# Patient Record
Sex: Female | Born: 1935 | Race: White | Hispanic: No | Smoking: Never smoker
Health system: Southern US, Community
[De-identification: ages and names within clinical notes are randomized; demographics above are authoritative.]

## PROBLEM LIST (undated history)

## (undated) DIAGNOSIS — R9430 Abnormal result of cardiovascular function study, unspecified: Secondary | ICD-10-CM

## (undated) DIAGNOSIS — I050 Rheumatic mitral stenosis: Secondary | ICD-10-CM

## (undated) DIAGNOSIS — I340 Nonrheumatic mitral (valve) insufficiency: Secondary | ICD-10-CM

## (undated) DIAGNOSIS — I10 Essential (primary) hypertension: Secondary | ICD-10-CM

## (undated) DIAGNOSIS — G309 Alzheimer's disease, unspecified: Principal | ICD-10-CM

## (undated) DIAGNOSIS — I5189 Other ill-defined heart diseases: Secondary | ICD-10-CM

## (undated) DIAGNOSIS — I350 Nonrheumatic aortic (valve) stenosis: Secondary | ICD-10-CM

## (undated) DIAGNOSIS — IMO0002 Reserved for concepts with insufficient information to code with codable children: Secondary | ICD-10-CM

## (undated) DIAGNOSIS — E119 Type 2 diabetes mellitus without complications: Secondary | ICD-10-CM

## (undated) DIAGNOSIS — E785 Hyperlipidemia, unspecified: Secondary | ICD-10-CM

## (undated) DIAGNOSIS — I517 Cardiomegaly: Secondary | ICD-10-CM

## (undated) DIAGNOSIS — F0280 Dementia in other diseases classified elsewhere without behavioral disturbance: Principal | ICD-10-CM

## (undated) HISTORY — DX: Abnormal result of cardiovascular function study, unspecified: R94.30

## (undated) HISTORY — DX: Other ill-defined heart diseases: I51.89

## (undated) HISTORY — DX: Alzheimer's disease, unspecified: G30.9

## (undated) HISTORY — DX: Cardiomegaly: I51.7

## (undated) HISTORY — PX: TOTAL HIP ARTHROPLASTY: SHX124

## (undated) HISTORY — DX: Reserved for concepts with insufficient information to code with codable children: IMO0002

## (undated) HISTORY — DX: Type 2 diabetes mellitus without complications: E11.9

## (undated) HISTORY — DX: Rheumatic mitral stenosis: I05.0

## (undated) HISTORY — DX: Nonrheumatic mitral (valve) insufficiency: I34.0

## (undated) HISTORY — DX: Nonrheumatic aortic (valve) stenosis: I35.0

## (undated) HISTORY — PX: ABDOMINAL HYSTERECTOMY: SHX81

## (undated) HISTORY — DX: Dementia in other diseases classified elsewhere without behavioral disturbance: F02.80

## (undated) HISTORY — DX: Hyperlipidemia, unspecified: E78.5

## (undated) HISTORY — DX: Essential (primary) hypertension: I10

---

## 2001-02-23 ENCOUNTER — Other Ambulatory Visit: Admission: RE | Admit: 2001-02-23 | Discharge: 2001-02-23 | Payer: Self-pay | Admitting: Obstetrics and Gynecology

## 2002-07-26 ENCOUNTER — Ambulatory Visit (HOSPITAL_COMMUNITY): Admission: RE | Admit: 2002-07-26 | Discharge: 2002-07-26 | Payer: Self-pay | Admitting: Internal Medicine

## 2004-07-24 ENCOUNTER — Ambulatory Visit: Payer: Self-pay | Admitting: Family Medicine

## 2004-08-06 ENCOUNTER — Ambulatory Visit: Payer: Self-pay | Admitting: Family Medicine

## 2004-08-12 ENCOUNTER — Ambulatory Visit: Payer: Self-pay | Admitting: Family Medicine

## 2004-08-27 ENCOUNTER — Ambulatory Visit: Payer: Self-pay | Admitting: Family Medicine

## 2005-02-23 ENCOUNTER — Ambulatory Visit: Payer: Self-pay | Admitting: Family Medicine

## 2005-06-02 ENCOUNTER — Encounter: Payer: Self-pay | Admitting: Cardiology

## 2005-06-02 ENCOUNTER — Ambulatory Visit: Payer: Self-pay | Admitting: Family Medicine

## 2005-06-03 ENCOUNTER — Ambulatory Visit: Payer: Self-pay | Admitting: Cardiology

## 2005-06-16 ENCOUNTER — Ambulatory Visit: Payer: Self-pay | Admitting: Cardiology

## 2005-09-01 ENCOUNTER — Ambulatory Visit: Payer: Self-pay | Admitting: Family Medicine

## 2005-11-30 ENCOUNTER — Ambulatory Visit: Payer: Self-pay | Admitting: Family Medicine

## 2006-06-08 ENCOUNTER — Ambulatory Visit: Payer: Self-pay | Admitting: Family Medicine

## 2006-07-12 ENCOUNTER — Ambulatory Visit: Payer: Self-pay | Admitting: Cardiology

## 2006-12-02 ENCOUNTER — Ambulatory Visit: Payer: Self-pay | Admitting: Family Medicine

## 2007-03-04 ENCOUNTER — Ambulatory Visit: Payer: Self-pay | Admitting: Family Medicine

## 2007-08-04 ENCOUNTER — Ambulatory Visit: Payer: Self-pay | Admitting: Cardiology

## 2007-08-16 ENCOUNTER — Ambulatory Visit: Payer: Self-pay | Admitting: Cardiology

## 2008-04-05 ENCOUNTER — Encounter: Payer: Self-pay | Admitting: Cardiology

## 2008-08-17 ENCOUNTER — Encounter: Payer: Self-pay | Admitting: Cardiology

## 2008-10-10 ENCOUNTER — Ambulatory Visit: Payer: Self-pay | Admitting: Cardiology

## 2009-04-10 DIAGNOSIS — E785 Hyperlipidemia, unspecified: Secondary | ICD-10-CM

## 2009-04-10 DIAGNOSIS — I10 Essential (primary) hypertension: Secondary | ICD-10-CM | POA: Insufficient documentation

## 2009-10-22 ENCOUNTER — Encounter: Payer: Self-pay | Admitting: Internal Medicine

## 2009-10-29 ENCOUNTER — Ambulatory Visit (HOSPITAL_COMMUNITY): Admission: RE | Admit: 2009-10-29 | Discharge: 2009-10-29 | Payer: Self-pay | Admitting: Internal Medicine

## 2009-10-29 ENCOUNTER — Ambulatory Visit: Payer: Self-pay | Admitting: Internal Medicine

## 2009-11-03 ENCOUNTER — Encounter: Payer: Self-pay | Admitting: Internal Medicine

## 2009-11-05 ENCOUNTER — Encounter: Payer: Self-pay | Admitting: Cardiology

## 2009-12-09 ENCOUNTER — Encounter: Payer: Self-pay | Admitting: Cardiology

## 2009-12-11 ENCOUNTER — Ambulatory Visit: Payer: Self-pay | Admitting: Cardiology

## 2009-12-17 ENCOUNTER — Ambulatory Visit: Payer: Self-pay | Admitting: Cardiology

## 2010-10-21 NOTE — Miscellaneous (Signed)
  Clinical Lists Changes  Problems: Removed problem of LEFT VENTRICULAR FUNCTION, DECREASED (ICD-429.2) Added new problem of AORTIC STENOSIS (ICD-424.1) Removed problem of MITRAL REGURG W/ AORTIC INSUFF, RHEUM/NON-RHEUM (ICD-396.3) Added new problem of MITRAL REGURGITATION (ICD-396.3) Observations: Added new observation of PAST MED HX: Aortic stenosis...mild to moderate.... echo... November, 2008 Mitral regurgitation...mild.... echo... November, 2008 Diastolic dysfunction.... echo.... November, 2008 EF 65%... echo... November, 2008 HYPERLIPIDEMIA-MIXED (ICD-272.4) HYPERTENSION, UNSPECIFIED (ICD-401.9)   (12/09/2009 16:48) Added new observation of PRIMARY MD: Joette Catching, MD (12/09/2009 16:48)       Past History:  Past Medical History: Aortic stenosis...mild to moderate.... echo... November, 2008 Mitral regurgitation...mild.... echo... November, 2008 Diastolic dysfunction.... echo.... November, 2008 EF 65%... echo... November, 2008 HYPERLIPIDEMIA-MIXED (ICD-272.4) HYPERTENSION, UNSPECIFIED (ICD-401.9)

## 2010-10-21 NOTE — Letter (Signed)
Summary: Internal Other [name]  Internal Other [name]   Imported By: Cloria Spring LPN 62/13/0865 78:46:96  _____________________________________________________________________  External Attachment:    Type:   Image     Comment:   External Document

## 2010-10-21 NOTE — Letter (Signed)
Summary: Patient Notice, Colon Biopsy Results  [facility]  [address]   [place], [place] [address]   Phone: [phone]  Fax: [phone]       November 03, 2009   [name] [address] [place], [place]  [address] [birth year]    Dear Ms. Furney,  I am pleased to inform you that the biopsies taken during your recent colonoscopy did not show any evidence of cancer upon pathologic examination.  Additional information/recommendations:  You should have a repeat colonoscopy examination  in 5 years.  Please call us if you are having persistent problems or have questions about your condition that have not been fully answered at this time.  Sincerely,    R. Roetta Sessions MD  [facility] Ph: [phone]    Fax: [phone]   Appended Document: Patient Notice, Colon Biopsy Results letter mailed to pt

## 2010-10-21 NOTE — Assessment & Plan Note (Signed)
Summary: 1 YR FU PER FEB REMINDER-SRS   Visit Type:  Follow-up Primary Provider:  Joette Catching, MD  CC:  aortic stenosis.  History of Present Illness: Patient is seen for followup of aortic stenosis and mitral regurgitation.  I saw her last January, 2010.  She is gaining weight and we talked about this.  She has shortness of breath only when walking up a hill.  She's not having any chest pain.  There is been no syncope or presyncope.  Preventive Screening-Counseling & Management  Alcohol-Tobacco     Smoking Status: never  Current Medications (verified): 1)  Fosinopril Sodium 10 Mg Tabs (Fosinopril Sodium) .... Take 1 Tablet By Mouth Once A Day 2)  Hydrochlorothiazide 25 Mg Tabs (Hydrochlorothiazide) .... Take 1 Tablet By Mouth Once A Day 3)  Caltrate 600+d 600-400 Mg-Unit Tabs (Calcium Carbonate-Vitamin D) .... Take 1 Tablet By Mouth Once A Day 4)  Fish Oil 1000 Mg Caps (Omega-3 Fatty Acids) .... Take 1 Tablet By Mouth Once A Day 5)  Crestor 20 Mg Tabs (Rosuvastatin Calcium) .... Take 1/2 Tablet By Mouth Once A Day 6)  Multivitamins  Tabs (Multiple Vitamin) .... Take 1 Tablet By Mouth Once A Day  Allergies (verified): 1)  ! Sulfa  Comments:  Nurse/Medical Assistant: The patient's medications were reviewed with the patient and were updated in the Medication List. Pt brought medication bottles to office visit.  Cyril Loosen, RN, BSN (December 11, 2009 12:23 PM)  Past History:  Past Medical History: Last updated: 12/09/2009 Aortic stenosis...mild to moderate.... echo... November, 2008 Mitral regurgitation...mild.... echo... November, 2008 Diastolic dysfunction.... echo.... November, 2008 EF 65%... echo... November, 2008 HYPERLIPIDEMIA-MIXED (ICD-272.4) HYPERTENSION, UNSPECIFIED (ICD-401.9)    Social History: Smoking Status:  never  Review of Systems       Patient denies fever, chills, headache, sweats, rash, change in vision, change in hearing, chest pain, cough,  nausea vomiting, urinary symptoms.  All other systems are reviewed and are negative.  Vital Signs:  Patient profile:   77 year old female Height:      62 inches Weight:      197 pounds BMI:     36.16 Pulse rate:   66 / minute BP sitting:   139 / 86  (left arm) Cuff size:   large  Vitals Entered By: Cyril Loosen, RN, BSN (December 11, 2009 12:18 PM)  Nutrition Counseling: Patient's BMI is greater than 25 and therefore counseled on weight management options. CC: aortic stenosis Comments No cardiac complaints   Physical Exam  General:  Patient is overweight but stable. Head:  head is atraumatic. Eyes:  no xanthelasma. Neck:  no jugular venous distention. Chest Wall:  no chest wall tenderness. Lungs:  lungs are clear.  Respiratory effort is nonlabored. Heart:  cardiac exam reveals an S1-S2.  There is a systolic murmur. Abdomen:  the abdomen is obese but soft Msk:  no musculoskeletal deformities Extremities:  no peripheral edema Skin:  no skin rashes Psych:  patient is oriented to person time and place.  Affect is normal.   Impression & Recommendations:  Problem # 1:  MITRAL REGURGITATION (ICD-396.3) Mitral regurgitation is mild by history.  It is time for followup 2-D echo.  Problem # 2:  AORTIC STENOSIS (ICD-424.1)  Her updated medication list for this problem includes:    Fosinopril Sodium 10 Mg Tabs (Fosinopril sodium) .Marland Kitchen... Take 1 tablet by mouth once a day    Hydrochlorothiazide 25 Mg Tabs (Hydrochlorothiazide) .Marland Kitchen... Take 1 tablet by mouth  once a day  Orders: EKG w/ Interpretation (93000) 2-D Echocardiogram (2D Echo) Aortic stenosis with mild to moderate 2008.  It is time for followup 2-D echo and this will be scheduled.  EKG is done today and reviewed by me.  She has normal sinus rhythm.  Problem # 3:  HYPERLIPIDEMIA-MIXED (ICD-272.4)  Her updated medication list for this problem includes:    Crestor 20 Mg Tabs (Rosuvastatin calcium) .Marland Kitchen... Take 1/2 tablet by  mouth once a day Patient is receiving appropriate therapy for her lipids.  No change in therapy.  Problem # 4:  HYPERTENSION, UNSPECIFIED (ICD-401.9)  Her updated medication list for this problem includes:    Fosinopril Sodium 10 Mg Tabs (Fosinopril sodium) .Marland Kitchen... Take 1 tablet by mouth once a day    Hydrochlorothiazide 25 Mg Tabs (Hydrochlorothiazide) .Marland Kitchen... Take 1 tablet by mouth once a day Blood pressure is controlled today.  No change in therapy.  Will obtain a 2-D echo.  I will review the results and be in touch with her.  I will see her for cardiology followup in one year.  Patient Instructions: 1)  Your physician recommends that you continue on your current medications as directed. Please refer to the Current Medication list given to you today. 2)  Your physician wants you to follow-up in: 1 year. You will receive a reminder letter in the mail about two months in advance. If you don't receive a letter, please call our office to schedule the follow-up appointment. 3)  Your physician has requested that you have an echocardiogram.  Echocardiography is a painless test that uses sound waves to create images of your heart. It provides your doctor with information about the size and shape of your heart and how well your heart's chambers and valves are working.  This procedure takes approximately one hour. There are no restrictions for this procedure.

## 2011-01-14 ENCOUNTER — Encounter: Payer: Self-pay | Admitting: *Deleted

## 2011-01-14 ENCOUNTER — Encounter: Payer: Self-pay | Admitting: Cardiology

## 2011-01-14 DIAGNOSIS — I517 Cardiomegaly: Secondary | ICD-10-CM | POA: Insufficient documentation

## 2011-01-14 DIAGNOSIS — E785 Hyperlipidemia, unspecified: Secondary | ICD-10-CM | POA: Insufficient documentation

## 2011-01-14 DIAGNOSIS — I5189 Other ill-defined heart diseases: Secondary | ICD-10-CM | POA: Insufficient documentation

## 2011-01-14 DIAGNOSIS — I340 Nonrheumatic mitral (valve) insufficiency: Secondary | ICD-10-CM | POA: Insufficient documentation

## 2011-01-14 DIAGNOSIS — R9430 Abnormal result of cardiovascular function study, unspecified: Secondary | ICD-10-CM | POA: Insufficient documentation

## 2011-01-14 DIAGNOSIS — I050 Rheumatic mitral stenosis: Secondary | ICD-10-CM | POA: Insufficient documentation

## 2011-01-14 DIAGNOSIS — I350 Nonrheumatic aortic (valve) stenosis: Secondary | ICD-10-CM | POA: Insufficient documentation

## 2011-01-14 DIAGNOSIS — I10 Essential (primary) hypertension: Secondary | ICD-10-CM | POA: Insufficient documentation

## 2011-01-15 ENCOUNTER — Ambulatory Visit (INDEPENDENT_AMBULATORY_CARE_PROVIDER_SITE_OTHER): Payer: Medicare HMO | Admitting: Cardiology

## 2011-01-15 ENCOUNTER — Encounter: Payer: Self-pay | Admitting: Cardiology

## 2011-01-15 DIAGNOSIS — I359 Nonrheumatic aortic valve disorder, unspecified: Secondary | ICD-10-CM

## 2011-01-15 DIAGNOSIS — I059 Rheumatic mitral valve disease, unspecified: Secondary | ICD-10-CM

## 2011-01-15 DIAGNOSIS — E785 Hyperlipidemia, unspecified: Secondary | ICD-10-CM

## 2011-01-15 DIAGNOSIS — I5189 Other ill-defined heart diseases: Secondary | ICD-10-CM

## 2011-01-15 DIAGNOSIS — I519 Heart disease, unspecified: Secondary | ICD-10-CM

## 2011-01-15 DIAGNOSIS — I10 Essential (primary) hypertension: Secondary | ICD-10-CM

## 2011-01-15 DIAGNOSIS — I050 Rheumatic mitral stenosis: Secondary | ICD-10-CM

## 2011-01-15 DIAGNOSIS — I350 Nonrheumatic aortic (valve) stenosis: Secondary | ICD-10-CM

## 2011-01-15 DIAGNOSIS — I340 Nonrheumatic mitral (valve) insufficiency: Secondary | ICD-10-CM

## 2011-01-15 NOTE — Assessment & Plan Note (Signed)
Her aortic valve disease is really quite mild.  No further studies are needed.

## 2011-01-15 NOTE — Assessment & Plan Note (Signed)
Patient has some diastolic dysfunction.  She has no signs of volume overload.

## 2011-01-15 NOTE — Assessment & Plan Note (Signed)
Mitral regurgitation is mild.  No further workup is needed.

## 2011-01-15 NOTE — Progress Notes (Signed)
HPI The patient is seen today for followup aortic valve sclerosis and mitral regurgitation.  I saw her last March, 2011. After that visit she had a two-dimensional echo.  The study showed excellent left ventricular function with ejection fraction of 70%.  Her aortic valve showed only aortic valve sclerosis.  There was mild mitral regurgitation.  There may be very slight mitral inflow obstruction but this is a borderline call.  Patient is doing very well.  She does not have any chest pain or shortness of breath.  There's been no syncope or presyncope. Allergies  Allergen Reactions  . Sulfonamide Derivatives     REACTION: Facial swelling    Current Outpatient Prescriptions  Medication Sig Dispense Refill  . aspirin 81 MG tablet Take 81 mg by mouth daily.        . calcium-vitamin D (OSCAL WITH D) 500-200 MG-UNIT per tablet Take 1 tablet by mouth daily.        . fosinopril (MONOPRIL) 10 MG tablet Take 10 mg by mouth daily.        . hydrochlorothiazide 25 MG tablet Take 25 mg by mouth daily.        Marland Kitchen levothyroxine (SYNTHROID, LEVOTHROID) 50 MCG tablet Take 50 mcg by mouth daily.        . metFORMIN (GLUCOPHAGE-XR) 500 MG 24 hr tablet Take 500 mg by mouth daily with breakfast.        . Multiple Vitamin (MULTIVITAMIN) tablet Take 1 tablet by mouth daily.        . Omega-3 Fatty Acids (FISH OIL) 1000 MG CAPS Take 1 capsule by mouth daily.        . rosuvastatin (CRESTOR) 20 MG tablet Take 10 mg by mouth at bedtime.       Marland Kitchen DISCONTD: Calcium Carbonate-Vitamin D (CALCARB 600/D) 600-400 MG-UNIT per tablet Take 1 tablet by mouth daily.          History   Social History  . Marital Status: Widowed    Spouse Name: N/A    Number of Children: N/A  . Years of Education: N/A   Occupational History  . RETIRED    Social History Main Topics  . Smoking status: Never Smoker   . Smokeless tobacco: Not on file  . Alcohol Use: No  . Drug Use: No  . Sexually Active: Not on file   Other Topics Concern  .  Not on file   Social History Narrative  . No narrative on file    No family history on file.  Past Medical History  Diagnosis Date  . Aortic stenosis     Mild to moderate, echo, femoral, 2008 /  mild, echo, March, 2011  . Mitral regurgitation     Mild, echo, November, 2008 /  mild, echo, March, 2011  . Diastolic dysfunction     Echo, 2008  . LVH (left ventricular hypertrophy)     Mild to moderate, echo, March, 2011  . Ejection fraction     65%, echo, 2008 /  70%, echo, March, 2011  . Dyslipidemia   . Hypertension   . Mitral stenosis     Severe left atrial dilatation, moderate mitral annular calcification, possible slight mitral inflow obstruction, echo, March, 2011    No past surgical history on file.  ROS  Patient denies fever, chills, headache, sweats, rash, change in vision, change in hearing, chest pain, cough, nausea vomiting, urinary symptoms.  All other systems are reviewed and are negative.  PHYSICAL EXAM Patient is  quite stable.  She is oriented to person time and place.  Affect is normal.  Head is atraumatic.  There is no xanthelasma.  There no carotid bruits.  There is no jugular venous distention.  Lungs are clear.  Respiratory effort is nonlabored.  Cardiac exam reveals S1 and S2.  There is a systolic crescendo decrescendo murmur of mild aortic valvular disease.  Abdomen is soft.  There is no peripheral edema.  There are no musculoskeletal deformities.  There no skin rashes.  Patient is overweight. Filed Vitals:   01/15/11 1027  BP: 137/83  Pulse: 71  Height: 5\' 1"  (1.549 m)  Weight: 188 lb (85.276 kg)  SpO2: 99%    EKG  EKG is done today and reviewed by me.  She has normal sinus rhythm.  There are no significant abnormalities  ASSESSMENT & PLAN

## 2011-01-15 NOTE — Patient Instructions (Signed)
Your physician you to follow up in 1 year. You will receive a reminder letter in the mail one-two months in advance. If you don't receive a letter, please call our office to schedule the follow-up appointment. Your physician recommends that you continue on your current medications as directed. Please refer to the Current Medication list given to you today. 

## 2011-01-15 NOTE — Assessment & Plan Note (Signed)
On echo I question whether she might have slight inflow obstruction.  This is not a significant problem.  No further workup is needed at this time.

## 2011-01-15 NOTE — Assessment & Plan Note (Signed)
Blood pressure is well controlled. No change in therapy. 

## 2011-01-15 NOTE — Assessment & Plan Note (Signed)
Patient's lipids are being treated.  No change in therapy.  I will plan to see her back in one year for cardiology followup.

## 2011-02-03 NOTE — Assessment & Plan Note (Signed)
[facility]                          [facility] OFFICE NOTE   [name], [name]                    MRN:          [id number]  DATE:08/04/2007                            DOB:          Oct 15, 1928    PRIMARY CARDIOLOGIST:  Luis Abed, MD, [facility]   REASON FOR VISIT:  Annual follow-up.   The patient returns to the clinic for continued monitoring of valvular  heart disease, with known history of mild aortic stenosis and  mild/moderate mitral regurgitation.  Her last two-dimensional  echocardiogram was in September of 2006, revealing preserved left  ventricular function (EF 60-65%) and concentric LVH, with severe left  atrial enlargement.   The patient has no known history of coronary artery disease.  However,  she does have several cardiac risk factors, notable for hypertension,  hyperlipidemia, and age.  She was recently started on Crestor, which is  followed by Dr. Stacie Acres in [place].  She is due for follow-up fasting  lipid profile next month.   Clinically, the patient reports no interim development of angina  pectoris, presyncope/syncope, or symptoms suggestive of congestive heart  failure.   Electrocardiogram today reveals normal sinus rhythm at 93 beats per  minute with left axis deviation and poor R wave progression; no  significant change from her previous study.   CURRENT MEDICATIONS:  1. Crestor 10 mg daily.  2. Hydrochlorothiazide 25 mg daily.  3. Lisinopril 10 mg daily.  4. Fish Oil 1200 mg daily.   PHYSICAL EXAMINATION:  VITAL SIGNS:  Blood pressure 146/84, pulse 94 and  regular, weight 199.8.  GENERAL:  A 77 year old female, moderately obese, sitting upright in no  distress.  HEENT:  Normocephalic and atraumatic.  NECK:  Palpable bilateral carotid pulses; no bruits; no JVD.  LUNGS:  Clear to auscultation.  HEART:  Regular rate and rhythm (S1 and S2).  Harsh grade 2-3/6 systolic  ejection murmur heard loudest at the  base, preserved S2, no diastolic  below.  ABDOMEN:  Protuberant, benign.  EXTREMITIES:  No significant pedal edema.  NEUROLOGY:  No focal deficit.   IMPRESSION:  1. Valvular heart disease.      a.     Mild aortic stenosis, mild/moderate mitral regurgitation by       two-dimensional echocardiogram, September 2006.  2. Preserved left ventricular function.  3. Hypertension.  4. Dyslipidemia.      a.     Recently started on Crestor.   PLAN:  1. Surveillance two-dimensional echocardiogram for reassessment of      severity of aortic stenosis and mitral regurgitation.  2. Recommendation is to start low dose aspirin at 81 mg daily for      primary prevention.  3. Return clinic follow-up with Dr. Myrtis Ser in one year.      Rozell Searing, PA-C  Electronically Signed      Luis Abed, MD, [facility]  Electronically Signed   GS/MedQ  DD: 08/04/2007  DT: 12020-01-1207  Job #: [id number]

## 2011-02-03 NOTE — Assessment & Plan Note (Signed)
[facility]                          [facility]ICE NOTE   Kayla, Cohen                    MRN:          [id number]  DATE:10/10/2008                            DOB:          1929/02/19    [name] is followed for her known aortic stenosis and mitral  regurgitation.  She is also followed for hypertension.  I saw her last  on August 04, 2007.  She had a followup 2-D echo after that time dated  August 16, 2007.  At that time, left ventricular chamber size was  normal.  Mean peak gradient across the aortic valve was 14 mmHg.  She  had an ejection fraction of 65%.  The findings were compatible with mild  LVH and a dilated left atrium.  She had mild-to-moderate aortic stenosis  and moderate mitral leaflet calcification with mild mitral regurgitation  and mild pulmonary hypertension.   She is fully active.  She is not having any chest pain or shortness of  breath.  She has not had syncope.   ALLERGIES.:  SULFA.   MEDICATIONS:  1. Fosinopril.  2. Hydrochlorothiazide.  3. Calcium.  4. Fish oil.  5. Crestor.   OTHER MEDICAL PROBLEMS:  See the list below.   REVIEW OF SYSTEMS:  She is not having any fevers or chills.  She is not  having any skin rashes, headaches, or eye problems.  Her review of  systems is negative.   PHYSICAL EXAMINATION:  VITAL SIGNS:  Blood pressure is 130/72 with a  pulse of 96.  Her weight is 196 pounds.  This is in a similar range of  her prior weight.  She is up 1 or 2 pounds.  GENERAL:  The patient is oriented to person, time, and place.  Affect is  normal.  HEENT:  Reveals no xanthelasma.  She has normal extraocular motion.  There is a radiated murmur to the neck.  LUNGS:  Clear.  Respiratory effort is not labored.  CARDIAC:  S1 and S2.  There are no clicks.  She does have a 3/6  crescendo-decrescendo murmur of aortic stenosis.  The second heart sound  is heard.  ABDOMEN:  Obese but soft.  She has  no significant peripheral edema.   EKG reveals left axis deviation.  She has normal sinus rhythm.  As  mentioned, she had an echo a year ago.   PROBLEMS:  1. Mild-to-moderate aortic stenosis by echo in November 2008.  She      does not need a followup echo today.  2. Normal LV function.  3. Hypertension treated.  4. Dyslipidemia treated.  5. History of sulfa allergy.  6. Mild mitral regurgitation.  7. Some evidence of diastolic dysfunction.  8. Mild left ventricular hypertrophy.   She is stable.  She does not need any further testing.  I will plan to  see her back in 1 year.     Luis Abed, MD, [facility]  Electronically Signed    JDK/MedQ  DD: 10/10/2008  DT: 10/11/2008  Job #: [id number]   cc:   Delaney Meigs, M.D.

## 2011-02-06 NOTE — Op Note (Signed)
   NAME:  [name], [name] NO.:  [id number]   MEDICAL RECORD NO.:  [id number]                  PATIENT TYPE:   LOCATION:                                       FACILITY:   PHYSICIAN:  R. Roetta Sessions, M.D.              DATE OF BIRTH:   DATE OF PROCEDURE:  07/26/2002  DATE OF DISCHARGE:                                 OPERATIVE REPORT   PROCEDURE PERFORMED:  Screening colonoscopy.   ENDOSCOPIST:  Jonathon Bellows, M.D.   INDICATIONS FOR PROCEDURE:  The patient is a pleasant 77 year old lady with  a positive family history of colorectal cancer (mother).  Colonoscopy at  [facility] five years ago demonstrated no significant abnormalities.  Colonoscopy is now being done as a high risk screening maneuver.  She is  devoid of any lower gastrointestinal tract symptoms,  _________ has been  discussed with [name] at length at the bedside of the potential risks,  benefits and alternatives have been reviewed and questions answered.   PROCEDURE NOTE:  Oxygen saturations and blood pressure, pulse and  respirations were monitored throughout the entire procedure.   CONSCIOUS SEDATION:  Versed 3 mg IV, Demerol 75 mg IV in divided doses.   INSTRUMENT USED:  Olympus video adult colonoscope.   FINDINGS:  Digital rectal exam revealed no abnormalities.   ENDOSCOPIC FINDINGS:  Prep was good.   RECTUM:  Examination of the rectal mucosa, including retroflexed view of the  anal verge, revealed only internal hemorrhoids.   COLON:  The colonic mucosa was surveyed from the rectosigmoid junction  through the left transverse right colon  to the area of the appendiceal  orifice, ileocecal valve and cecum.  These structures are well seen and  photographed for the record.  No colonic mucosal abnormalities were noted.  Upon advancement of the scope to the cecum.  From the level of the cecum and  ileocecal valve, scope was slowly withdrawn.  All previously mentioned  mucosal surfaces were again seen and again, no other abnormalities were  observed.   The patient tolerated the procedure well and was reacted in endoscopy.    IMPRESSION:  1. Internal hemorrhoids, otherwise normal rectum.  2. Normal colon.   RECOMMENDATIONS:  Repeat colonoscopy in five years.                                                Jonathon Bellows, M.D.    RMR/MEDQ  D:  07/26/2002  T:  07/26/2002  Job:  [id number]   cc:   Colon Flattery  [address]  [place]  [place] [address]  Fax: [phone]

## 2011-02-06 NOTE — Assessment & Plan Note (Signed)
[facility]                            [facility]ICE NOTE   SUZAN, MANON                      MRN:          [id number]  DATE:07/12/2006                            DOB:          May 10, 1929    Mrs. Rosen is seen for cardiology followup.  I saw her last in September  2006.  She does have mild aortic stenosis and mild to moderate mitral  regurgitation.  She is not having any chest pain.  She has had no syncope.  There is no pre-syncope.  She has no PND or orthopnea.  She is going about  full activities. When I saw her on June 03, 2005 we arranged for an  echo on June 16, 2005.  The study had not changed.  She had normal LV  function.  She had mild aortic stenosis and mild to moderate mitral  regurgitation and this had not changed.   She is stable now.   PAST MEDICAL HISTORY:   ALLERGIES:  SULFA.   MEDICATIONS:  1. Fosinopril 10.  2. Hydrochlorothiazide 25.  3. Multivitamins.  4. Calcium.  5. Fish oil.   OTHER MEDICAL PROBLEMS:  See the list below.   REVIEW OF SYSTEMS:  The patient has no complaints at this time.  She is  stable and going about full activities and the review of systems is  negative.   PHYSICAL EXAMINATION:  VITAL SIGNS:  Blood pressure today 140/90.  Her pulse  is 66.  Weight is 190 pounds.  GENERAL:  The patient is well-nourished and in fact she is overweight.  The  patient is oriented to person, time and place.  Her affect is normal.  HEENT:  Reveals no xanthelasma.  She has normal extraocular motion.  NECK:  There is no jugular venous distention.  She has a radiated murmur  into her neck.  When we see her next time, we will consider followup  Dopplers.  LUNGS:  Clear. Respiratory effort is not labored.  CARDIAC EXAM:  Reveals S1 with S2.  There is a 2-3/6 murmur that is  crescendo decrescendo.  ABDOMEN:  Soft.  There are no masses or bruits.  She has no significant  peripheral edema.  There  are no musculoskeletal deformities.   No labs are done today.   PROBLEMS INCLUDE:  1. History of SULFA ALLERGY.  2. Hyperlipidemia.  3. Hypertension.  4. Aortic stenosis and mild mitral regurgitation.  She does not need an      echo this year.  I will see her back in 1 year.  5. Left ventricular hypertrophy.  6. Excellent left ventricular function.  7. Evidence of some diastolic dysfunction.   Continued aggressive treatment of her blood pressure and lipids is  appropriate and the patient will be seeing Dr. Lysbeth Galas back.  I will see her  back in 1 year for cardiology followup.            ______________________________  Luis Abed, MD, [facility]     JDK/MedQ  DD:  07/12/2006  DT:  07/13/2006  Job #:  [id number]  cc:   Delaney Meigs, M.D.

## 2012-03-09 ENCOUNTER — Encounter: Payer: Self-pay | Admitting: Cardiology

## 2012-03-09 ENCOUNTER — Ambulatory Visit (INDEPENDENT_AMBULATORY_CARE_PROVIDER_SITE_OTHER): Payer: Medicare HMO | Admitting: Cardiology

## 2012-03-09 VITALS — BP 130/79 | HR 59 | Ht 61.0 in | Wt 168.0 lb

## 2012-03-09 DIAGNOSIS — I359 Nonrheumatic aortic valve disorder, unspecified: Secondary | ICD-10-CM

## 2012-03-09 DIAGNOSIS — I10 Essential (primary) hypertension: Secondary | ICD-10-CM

## 2012-03-09 DIAGNOSIS — I350 Nonrheumatic aortic (valve) stenosis: Secondary | ICD-10-CM

## 2012-03-09 NOTE — Assessment & Plan Note (Signed)
Her valvular heart disease including mild aortic stenosis, mild mitral regurgitation, and mild functional mitral stenosis are all stable. She has no symptoms. She does not need a followup 2-D echo this year. I will see her for cardiology follow up in one year.

## 2012-03-09 NOTE — Assessment & Plan Note (Signed)
Blood pressures control. No change in therapy. 

## 2012-03-09 NOTE — Patient Instructions (Addendum)
Your physician recommends that you schedule a follow-up appointment in: 1 year. You will receive a reminder letter in the mail in about months 10 reminding you to call and schedule your appointment. If you don't receive this letter, please contact our office.  Your physician recommends that you continue on your current medications as directed. Please refer to the Current Medication list given to you today.  

## 2012-03-09 NOTE — Progress Notes (Signed)
HPI The patient is doing well. I follow her valvular heart disease. There is a history of mild aortic stenosis mild mitral regurgitation and possibly mild mitral in flow obstruction. Her last echo was in March, 2011. She has no symptoms. She is fully active. She has no chest pain or shortness of breath. There is no syncope or presyncope.  Allergies  Allergen Reactions  . Sulfonamide Derivatives     REACTION: Facial swelling    Current Outpatient Prescriptions  Medication Sig Dispense Refill  . aspirin 81 MG tablet Take 81 mg by mouth daily.        . calcium-vitamin D (OSCAL WITH D) 500-200 MG-UNIT per tablet Take 1 tablet by mouth daily.        . fosinopril (MONOPRIL) 10 MG tablet Take 10 mg by mouth daily.        . hydrochlorothiazide 25 MG tablet Take 25 mg by mouth daily.        Marland Kitchen levothyroxine (SYNTHROID, LEVOTHROID) 50 MCG tablet Take 50 mcg by mouth daily.        . metFORMIN (GLUCOPHAGE-XR) 500 MG 24 hr tablet Take 500 mg by mouth daily with breakfast.        . Multiple Vitamin (MULTIVITAMIN) tablet Take 1 tablet by mouth daily.        . rosuvastatin (CRESTOR) 20 MG tablet Take 10 mg by mouth at bedtime.         History   Social History  . Marital Status: Widowed    Spouse Name: N/A    Number of Children: N/A  . Years of Education: N/A   Occupational History  . RETIRED    Social History Main Topics  . Smoking status: Never Smoker   . Smokeless tobacco: Never Used  . Alcohol Use: No  . Drug Use: No  . Sexually Active: Not on file   Other Topics Concern  . Not on file   Social History Narrative  . No narrative on file    No family history on file.  Past Medical History  Diagnosis Date  . Aortic stenosis     Mild to moderate, echo, femoral, 2008 /  mild, echo, March, 2011  . Mitral regurgitation     Mild, echo, November, 2008 /  mild, echo, March, 2011  . Diastolic dysfunction     Echo, 2008  . LVH (left ventricular hypertrophy)     Mild to moderate,  echo, March, 2011  . Ejection fraction     65%, echo, 2008 /  70%, echo, March, 2011  . Dyslipidemia   . Hypertension   . Mitral stenosis     Severe left atrial dilatation, moderate mitral annular calcification, possible slight mitral inflow obstruction, echo, March, 2011    No past surgical history on file.  ROS   Patient denies fever, chills, headache, sweats, rash, change in vision, change in hearing, chest pain, cough, nausea vomiting, urinary symptoms. All other systems are reviewed and are negative.  PHYSICAL EXAM  Patient is overweight. She appears quite healthy. There is no jugulovenous distention. She's oriented to person time and place. Affect is normal. Lungs are clear. Respiratory effort is nonlabored. Cardiac exam reveals S1 and S2. There is a 2/6 crescendo decrescendo systolic murmur. The second heart sound is preserved. Abdomen is soft. There is no peripheral edema.  Filed Vitals:   03/09/12 1016  Height: 5\' 1"  (1.549 m)  Weight: 168 lb (76.204 kg)   EKG is done today and  reviewed by me. There is very mild sinus bradycardia. There is decreased anterior R wave progression.There is no significant change from the past.  ASSESSMENT & PLAN

## 2013-08-23 ENCOUNTER — Encounter: Payer: Self-pay | Admitting: Cardiology

## 2013-09-06 ENCOUNTER — Encounter: Payer: Self-pay | Admitting: Neurology

## 2013-09-08 ENCOUNTER — Ambulatory Visit (INDEPENDENT_AMBULATORY_CARE_PROVIDER_SITE_OTHER): Payer: Medicare HMO | Admitting: Neurology

## 2013-09-08 ENCOUNTER — Encounter: Payer: Self-pay | Admitting: Neurology

## 2013-09-08 ENCOUNTER — Telehealth: Payer: Self-pay | Admitting: Neurology

## 2013-09-08 ENCOUNTER — Encounter (INDEPENDENT_AMBULATORY_CARE_PROVIDER_SITE_OTHER): Payer: Self-pay

## 2013-09-08 VITALS — BP 157/95 | HR 109 | Ht 60.5 in | Wt 160.0 lb

## 2013-09-08 DIAGNOSIS — F0280 Dementia in other diseases classified elsewhere without behavioral disturbance: Secondary | ICD-10-CM

## 2013-09-08 HISTORY — DX: Dementia in other diseases classified elsewhere, unspecified severity, without behavioral disturbance, psychotic disturbance, mood disturbance, and anxiety: F02.80

## 2013-09-08 NOTE — Progress Notes (Signed)
Reason for visit: Dementia  [name] is a 79 y.o. female  History of present illness:  [name] is an 79 year old right-handed white female with a history of a relatively rapidly progressive dementia. The patient has been living independently at home. The patient fortunately lives near other family members. Four or 5 months prior to this evaluation, the family began noting a relatively rapid change in her cognitive processing. The patient has developed significant problems with short-term memory, associated with word finding problems. The patient has been unable to pay her bills over the last one month. The patient is still operating a motor vehicle, driving short distances from family members houses to her own. At times, the family has to start the car and put it into drive because the patient cannot figure out how to do it on her on. The patient has gone on walks out of the house, and left the keys inside the house, unable to get back into her home. The patient is no longer cooking, and it is not clear if she is taking her medications properly. The patient has denied any problems with numbness or weakness of the extremities, but she has had some change in her balance, with occasional falls. The patient has been set up for a head scan at [facility], but results are not yet available. The patient has had blood work that includes a thyroid profile and a B12 level, the results of this are not known to me. A sedimentation rate was also checked. The patient has not had any issues with control of the bowels or the bladder. The patient is sent to this office for further evaluation.   Past Medical History  Diagnosis Date  . Aortic stenosis     Mild to moderate, echo, femoral, 2008 /  mild, echo, March, 2011  . Mitral regurgitation     Mild, echo, November, 2008 /  mild, echo, March, 2011  . Diastolic dysfunction     Echo, 2008  . LVH (left ventricular hypertrophy)     Mild to  moderate, echo, March, 2011  . Ejection fraction     65%, echo, 2008 /  70%, echo, March, 2011  . Dyslipidemia   . Hypertension   . Mitral stenosis     Severe left atrial dilatation, moderate mitral annular calcification, possible slight mitral inflow obstruction, echo, March, 2011  . Alzheimer's disease 09/08/2013  . Dyslipidemia   . Diabetes mellitus without complication     Past Surgical History  Procedure Laterality Date  . Abdominal hysterectomy      Family History  Problem Relation Age of Onset  . Diabetes Brother     Social history:  reports that she has never smoked. She has never used smokeless tobacco. She reports that she does not drink alcohol or use illicit drugs.  Medications:  Current Outpatient Prescriptions on File Prior to Visit  Medication Sig Dispense Refill  . aspirin 81 MG tablet Take 81 mg by mouth daily.        . calcium-vitamin D (OSCAL WITH D) 500-200 MG-UNIT per tablet Take 1 tablet by mouth daily.        . fosinopril (MONOPRIL) 10 MG tablet Take 10 mg by mouth daily.        . hydrochlorothiazide 25 MG tablet Take 25 mg by mouth daily.        Marland Kitchen levothyroxine (SYNTHROID, LEVOTHROID) 50 MCG tablet Take 50 mcg by mouth daily.        Marland Kitchen  metFORMIN (GLUCOPHAGE-XR) 500 MG 24 hr tablet Take 500 mg by mouth daily with breakfast.        . Omega-3 Fatty Acids (FISH OIL) 1200 MG CAPS Take 1 capsule by mouth daily.       No current facility-administered medications on file prior to visit.      Allergies  Allergen Reactions  . Sulfonamide Derivatives     REACTION: Facial swelling    ROS:  Out of a complete 14 system review of symptoms, the patient complains only of the following symptoms, and all other reviewed systems are negative.  Moles Eye pain Cough Memory loss, confusion  Blood pressure 157/95, pulse 109, height 5' 0.5" (1.537 m), weight 160 lb (72.576 kg).  Physical Exam  General: The patient is alert and cooperative at the time of the  examination. The patient is moderately obese.  Head: Pupils are equal, round, and reactive to light. Discs are flat bilaterally.  Neck: The neck is supple, no carotid bruits are noted.  Respiratory: The respiratory examination is notable for bilateral wheezes.  Cardiovascular: The cardiovascular examination reveals a regular rate and rhythm, a grade 2/6 systolic ejection murmur at the aortic area is noted.  Skin: Extremities are without significant edema.  Neurologic Exam  Mental status: Mini-Mental status examination done today shows a total score of 11/30.  Cranial nerves: Facial symmetry is present. There is good sensation of the face to pinprick and soft touch bilaterally. The strength of the facial muscles and the muscles to head turning and shoulder shrug are normal bilaterally. Speech is well enunciated, no aphasia or dysarthria is noted. Extraocular movements are full. Visual fields are full.  Motor: The motor testing reveals 5 over 5 strength of all 4 extremities. Good symmetric motor tone is noted throughout.  Sensory: Sensory testing is intact to pinprick, soft touch, vibration sensation, and position sense on all 4 extremities, with exception that position sense is depressed on all fours, and the patient perseverates with her answers on sensory testing.. No evidence of extinction is noted.  Coordination: Cerebellar testing reveals good finger-nose-finger and heel-to-shin bilaterally. The patient has significant apraxia with the use of the arms and legs.  Gait and station: Gait is normal. Tandem gait is slightly unsteady. Romberg is negative. No drift is seen.  Reflexes: Deep tendon reflexes are symmetric and normal bilaterally. Toes are downgoing bilaterally.   Assessment/Plan:  1. Dementia, rapidly progressive  2. Diabetes  3. Dyslipidemia  The patient has had a rapidly progressive course of dementia. At the current time, the patient is scoring in the low moderate  range of dementia, and she likely will need increasing supervision over time. I have recommended that the patient not live by herself at this time, and that the driving be curtailed at this time. The patient will sent for further blood work evaluation, and a head scan is pending I will ask that the results of the CT or MRI be sent to this office. The patient clearly has risk factors for cerebrovascular disease, but a low-grade tumor also needs to be excluded. A lumbar puncture may be indicated in the future, as Jacob-Creuzfeld disease is in the differential diagnosis. An EEG study will be checked. The patient will be placed on Aricept if the head scan and blood work are unremarkable. The patient will followup in 3 months. I have asked her to stop the Crestor for now, as occasionally this can lead to an encephalopathy.  Contact number for family includes Misty Stanley  at [phone], cell phone number is [phone].  Marlan Palau MD 09/09/2013 9:07 AM  [facility] [address] [place], [place] [address]  Phone [phone] Fax [phone]

## 2013-09-08 NOTE — Telephone Encounter (Signed)
PATIENT DID NOT SCHEDULE R/EEG, 3 MONTH F/U AND LAB @ CHECK OUT--WILL CALL BACK TO SCHEDULE

## 2013-09-08 NOTE — Patient Instructions (Addendum)
Dementia Dementia is a general term for problems with brain function. A person with dementia has memory loss and a hard time with at least one other brain function such as thinking, speaking, or problem solving. Dementia can affect social functioning, how you do your job, your mood, or your personality. The changes may be hidden for a long time. The earliest forms of this disease are usually not detected by family or friends. Dementia can be:  Irreversible.  Potentially reversible.  Partially reversible.  Progressive. This means it can get worse over time. CAUSES  Irreversible dementia causes may include:  Degeneration of brain cells (Alzheimer's disease or lewy body dementia).  Multiple small strokes (vascular dementia).  Infection (chronic meningitis or Creutzfelt-Jakob disease).  Frontotemporal dementia. This affects younger people, age 40 to 70, compared to those who have Alzheimer's disease.  Dementia associated with other disorders like Parkinson's disease, Huntington's disease, or HIV-associated dementia. Potentially or partially reversible dementia causes may include:  Medicines.  Metabolic causes such as excessive alcohol intake, vitamin B12 deficiency, or thyroid disease.  Masses or pressure in the brain such as a tumor, blood clot, or hydrocephalus. SYMPTOMS  Symptoms are often hard to detect. Family members or coworkers may not notice them early in the disease process. Different people with dementia may have different symptoms. Symptoms can include:  A hard time with memory, especially recent memory. Long-term memory may not be impaired.  Asking the same question multiple times or forgetting something someone just said.  A hard time speaking your thoughts or finding certain words.  A hard time solving problems or performing familiar tasks (such as how to use a telephone).  Sudden changes in mood.  Changes in personality, especially increasing moodiness or  mistrust.  Depression.  A hard time understanding complex ideas that were never a problem in the past. DIAGNOSIS  There are no specific tests for dementia.   Your caregiver may recommend a thorough evaluation. This is because some forms of dementia can be reversible. The evaluation will likely include a physical exam and getting a detailed history from you and a family member. The history often gives the best clues and suggestions for a diagnosis.  Memory testing may be done. A detailed brain function evaluation called neuropsychologic testing may be helpful.  Lab tests and brain imaging (such as a CT scan or MRI scan) are sometimes important.  Sometimes observation and re-evaluation over time is very helpful. TREATMENT  Treatment depends on the cause.   If the problem is a vitamin deficiency, it may be helped or cured with supplements.  For dementias such as Alzheimer's disease, medicines are available to stabilize or slow the course of the disease. There are no cures for this type of dementia.  Your caregiver can help direct you to groups, organizations, and other caregivers to help with decisions in the care of you or your loved one. HOME CARE INSTRUCTIONS The care of individuals with dementia is varied and dependent upon the progression of the dementia. The following suggestions are intended for the person living with, or caring for, the person with dementia.  Create a safe environment.  Remove the locks on bathroom doors to prevent the person from accidentally locking himself or herself in.  Use childproof latches on kitchen cabinets and any place where cleaning supplies, chemicals, or alcohol are kept.  Use childproof covers in unused electrical outlets.  Install childproof devices to keep doors and windows secured.  Remove stove knobs or install safety   knobs and an automatic shut-off on the stove.  Lower the temperature on water heaters.  Label medicines and keep them  locked up.  Secure knives, lighters, matches, power tools, and guns, and keep these items out of reach.  Keep the house free from clutter. Remove rugs or anything that might contribute to a fall.  Remove objects that might break and hurt the person.  Make sure lighting is good, both inside and outside.  Install grab rails as needed.  Use a monitoring device to alert you to falls or other needs for help.  Reduce confusion.  Keep familiar objects and people around.  Use night lights or dim lights at night.  Label items or areas.  Use reminders, notes, or directions for daily activities or tasks.  Keep a simple, consistent routine for waking, meals, bathing, dressing, and bedtime.  Create a calm, quiet environment.  Place large clocks and calendars prominently.  Display emergency numbers and home address near all telephones.  Use cues to establish different times of the day. An example is to open curtains to let the natural light in during the day.   Use effective communication.  Choose simple words and short sentences.  Use a gentle, calm tone of voice.  Be careful not to interrupt.  If the person is struggling to find a word or communicate a thought, try to provide the word or thought.  Ask one question at a time. Allow the person ample time to answer questions. Repeat the question again if the person does not respond.  Reduce nighttime restlessness.  Provide a comfortable bed.  Have a consistent nighttime routine.  Ensure a regular walking or physical activity schedule. Involve the person in daily activities as much as possible.  Limit napping during the day.  Limit caffeine.  Attend social events that stimulate rather than overwhelm the senses.  Encourage good nutrition and hydration.  Reduce distractions during meal times and snacks.  Avoid foods that are too hot or too cold.  Monitor chewing and swallowing ability.  Continue with routine vision,  hearing, dental, and medical screenings.  Only give over-the-counter or prescription medicines as directed by the caregiver.  Monitor driving abilities. Do not allow the person to drive when safe driving is no longer possible.  Register with an identification program which could provide location assistance in the event of a missing person situation. SEEK MEDICAL CARE IF:   New behavioral problems start such as moodiness, aggressiveness, or seeing things that are not there (hallucinations).  Any new problem with brain function happens. This includes problems with balance, speech, or falling a lot.  Problems with swallowing develop.  Any symptoms of other illness happen. Small changes or worsening in any aspect of brain function can be a sign that the illness is getting worse. It can also be a sign of another medical illness such as infection. Seeing a caregiver right away is important. SEEK IMMEDIATE MEDICAL CARE IF:   A fever develops.  New or worsened confusion develops.  New or worsened sleepiness develops.  Staying awake becomes hard to do. Document Released: 03/03/2001 Document Revised: 11/30/2011 Document Reviewed: 02/02/2011 [facility] Patient Information 2014 [place], [place].    NO driving. Please stop the Crestor (cholesterol medication) for now. Have the head scan report sent to our office. The family needs to increase supervision with Ms. [name]. She should not live alone.

## 2014-03-17 ENCOUNTER — Emergency Department (HOSPITAL_COMMUNITY): Payer: Medicare HMO

## 2014-03-17 ENCOUNTER — Encounter (HOSPITAL_COMMUNITY): Payer: Self-pay | Admitting: Emergency Medicine

## 2014-03-17 ENCOUNTER — Encounter (HOSPITAL_COMMUNITY)
Admission: EM | Disposition: A | Discharge status: Skilled Nursing Facility | Payer: Self-pay | Source: Home / Self Care | Attending: Internal Medicine

## 2014-03-17 ENCOUNTER — Inpatient Hospital Stay (HOSPITAL_COMMUNITY)
Admission: EM | Admit: 2014-03-17 | Discharge: 2014-03-20 | DRG: 481 | Disposition: A | Discharge status: Skilled Nursing Facility | Payer: Medicare HMO | Attending: Internal Medicine | Admitting: Internal Medicine

## 2014-03-17 DIAGNOSIS — Z833 Family history of diabetes mellitus: Secondary | ICD-10-CM

## 2014-03-17 DIAGNOSIS — I350 Nonrheumatic aortic (valve) stenosis: Secondary | ICD-10-CM

## 2014-03-17 DIAGNOSIS — E785 Hyperlipidemia, unspecified: Secondary | ICD-10-CM | POA: Diagnosis present

## 2014-03-17 DIAGNOSIS — I498 Other specified cardiac arrhythmias: Secondary | ICD-10-CM | POA: Diagnosis present

## 2014-03-17 DIAGNOSIS — I340 Nonrheumatic mitral (valve) insufficiency: Secondary | ICD-10-CM

## 2014-03-17 DIAGNOSIS — I10 Essential (primary) hypertension: Secondary | ICD-10-CM | POA: Diagnosis present

## 2014-03-17 DIAGNOSIS — M899 Disorder of bone, unspecified: Secondary | ICD-10-CM | POA: Diagnosis present

## 2014-03-17 DIAGNOSIS — F0280 Dementia in other diseases classified elsewhere without behavioral disturbance: Secondary | ICD-10-CM | POA: Diagnosis present

## 2014-03-17 DIAGNOSIS — F0390 Unspecified dementia without behavioral disturbance: Secondary | ICD-10-CM | POA: Diagnosis present

## 2014-03-17 DIAGNOSIS — I509 Heart failure, unspecified: Secondary | ICD-10-CM | POA: Diagnosis present

## 2014-03-17 DIAGNOSIS — I5189 Other ill-defined heart diseases: Secondary | ICD-10-CM

## 2014-03-17 DIAGNOSIS — W19XXXA Unspecified fall, initial encounter: Secondary | ICD-10-CM | POA: Diagnosis present

## 2014-03-17 DIAGNOSIS — E119 Type 2 diabetes mellitus without complications: Secondary | ICD-10-CM | POA: Diagnosis present

## 2014-03-17 DIAGNOSIS — Z79899 Other long term (current) drug therapy: Secondary | ICD-10-CM

## 2014-03-17 DIAGNOSIS — R9430 Abnormal result of cardiovascular function study, unspecified: Secondary | ICD-10-CM

## 2014-03-17 DIAGNOSIS — N390 Urinary tract infection, site not specified: Secondary | ICD-10-CM | POA: Diagnosis present

## 2014-03-17 DIAGNOSIS — S72009A Fracture of unspecified part of neck of unspecified femur, initial encounter for closed fracture: Secondary | ICD-10-CM

## 2014-03-17 DIAGNOSIS — S72143A Displaced intertrochanteric fracture of unspecified femur, initial encounter for closed fracture: Principal | ICD-10-CM | POA: Diagnosis present

## 2014-03-17 DIAGNOSIS — I050 Rheumatic mitral stenosis: Secondary | ICD-10-CM

## 2014-03-17 DIAGNOSIS — S7223XA Displaced subtrochanteric fracture of unspecified femur, initial encounter for closed fracture: Secondary | ICD-10-CM | POA: Diagnosis present

## 2014-03-17 DIAGNOSIS — E039 Hypothyroidism, unspecified: Secondary | ICD-10-CM | POA: Diagnosis present

## 2014-03-17 DIAGNOSIS — I5032 Chronic diastolic (congestive) heart failure: Secondary | ICD-10-CM | POA: Diagnosis present

## 2014-03-17 DIAGNOSIS — S72001A Fracture of unspecified part of neck of right femur, initial encounter for closed fracture: Secondary | ICD-10-CM

## 2014-03-17 DIAGNOSIS — G309 Alzheimer's disease, unspecified: Secondary | ICD-10-CM | POA: Diagnosis present

## 2014-03-17 DIAGNOSIS — M949 Disorder of cartilage, unspecified: Secondary | ICD-10-CM

## 2014-03-17 DIAGNOSIS — D62 Acute posthemorrhagic anemia: Secondary | ICD-10-CM | POA: Diagnosis not present

## 2014-03-17 LAB — COMPREHENSIVE METABOLIC PANEL
ALT: 11 U/L (ref 0–35)
AST: 16 U/L (ref 0–37)
Albumin: 2.9 g/dL — ABNORMAL LOW (ref 3.5–5.2)
Alkaline Phosphatase: 59 U/L (ref 39–117)
BUN: 11 mg/dL (ref 6–23)
CALCIUM: 8.7 mg/dL (ref 8.4–10.5)
CO2: 24 mEq/L (ref 19–32)
Chloride: 102 mEq/L (ref 96–112)
Creatinine, Ser: 0.55 mg/dL (ref 0.50–1.10)
GFR calc Af Amer: >90 mL/min (ref >90)
GFR, EST NON AFRICAN AMERICAN: 85 mL/min — AB (ref >90)
GLUCOSE: 260 mg/dL — AB (ref 70–99)
Potassium: 4.6 mEq/L (ref 3.7–5.3)
SODIUM: 138 meq/L (ref 137–147)
TOTAL PROTEIN: 6.4 g/dL (ref 6.0–8.3)
Total Bilirubin: 0.7 mg/dL (ref 0.3–1.2)

## 2014-03-17 LAB — ABO/RH: ABO/RH(D): A POS

## 2014-03-17 LAB — CBC WITH DIFFERENTIAL/PLATELET
BASOS PCT: 0 % (ref 0–1)
Basophils Absolute: 0 10*3/uL (ref 0.0–0.1)
EOS ABS: 0 10*3/uL (ref 0.0–0.7)
Eosinophils Relative: 0 % (ref 0–5)
HCT: 35.9 % — ABNORMAL LOW (ref 36.0–46.0)
HEMOGLOBIN: 12.2 g/dL (ref 12.0–15.0)
Lymphocytes Relative: 9 % — ABNORMAL LOW (ref 12–46)
Lymphs Abs: 1 10*3/uL (ref 0.7–4.0)
MCH: 33.8 pg (ref 26.0–34.0)
MCHC: 34 g/dL (ref 30.0–36.0)
MCV: 99.4 fL (ref 78.0–100.0)
MONOS PCT: 10 % (ref 3–12)
Monocytes Absolute: 1.1 10*3/uL — ABNORMAL HIGH (ref 0.1–1.0)
NEUTROS ABS: 8.8 10*3/uL — AB (ref 1.7–7.7)
NEUTROS PCT: 81 % — AB (ref 43–77)
PLATELETS: 244 10*3/uL (ref 150–400)
RBC: 3.61 MIL/uL — AB (ref 3.87–5.11)
RDW: 14.3 % (ref 11.5–15.5)
WBC: 11 10*3/uL — ABNORMAL HIGH (ref 4.0–10.5)

## 2014-03-17 LAB — URINALYSIS, ROUTINE W REFLEX MICROSCOPIC
BILIRUBIN URINE: NEGATIVE
Glucose, UA: 500 mg/dL — AB
Ketones, ur: 40 mg/dL — AB
NITRITE: POSITIVE — AB
PH: 6.5 (ref 5.0–8.0)
Protein, ur: NEGATIVE mg/dL
Specific Gravity, Urine: 1.021 (ref 1.005–1.030)
Urobilinogen, UA: 1 mg/dL (ref 0.0–1.0)

## 2014-03-17 LAB — SURGICAL PCR SCREEN
MRSA, PCR: NEGATIVE
Staphylococcus aureus: NEGATIVE

## 2014-03-17 LAB — BASIC METABOLIC PANEL
BUN: 9 mg/dL (ref 6–23)
CALCIUM: 9 mg/dL (ref 8.4–10.5)
CO2: 23 mEq/L (ref 19–32)
Chloride: 99 mEq/L (ref 96–112)
Creatinine, Ser: 0.56 mg/dL (ref 0.50–1.10)
GFR, EST NON AFRICAN AMERICAN: 85 mL/min — AB (ref >90)
GLUCOSE: 213 mg/dL — AB (ref 70–99)
POTASSIUM: 3.7 meq/L (ref 3.7–5.3)
SODIUM: 138 meq/L (ref 137–147)

## 2014-03-17 LAB — URINE MICROSCOPIC-ADD ON

## 2014-03-17 LAB — CBC
HCT: 35.2 % — ABNORMAL LOW (ref 36.0–46.0)
HEMOGLOBIN: 12.3 g/dL (ref 12.0–15.0)
MCH: 35.2 pg — AB (ref 26.0–34.0)
MCHC: 34.9 g/dL (ref 30.0–36.0)
MCV: 100.9 fL — AB (ref 78.0–100.0)
Platelets: 233 10*3/uL (ref 150–400)
RBC: 3.49 MIL/uL — ABNORMAL LOW (ref 3.87–5.11)
RDW: 14.3 % (ref 11.5–15.5)
WBC: 12.2 10*3/uL — ABNORMAL HIGH (ref 4.0–10.5)

## 2014-03-17 LAB — TYPE AND SCREEN
ABO/RH(D): A POS
ANTIBODY SCREEN: NEGATIVE

## 2014-03-17 LAB — PROTIME-INR
INR: 1.14 (ref 0.00–1.49)
Prothrombin Time: 14.6 seconds (ref 11.6–15.2)

## 2014-03-17 SURGERY — FIXATION, FRACTURE, INTERTROCHANTERIC, WITH INTRAMEDULLARY ROD
Anesthesia: General | Laterality: Right

## 2014-03-17 MED ORDER — HYDROCODONE-ACETAMINOPHEN 5-325 MG PO TABS
1.0000 | ORAL_TABLET | ORAL | Status: AC | PRN
  Administered 2014-03-18: 1 via ORAL
  Filled 2014-03-17: qty 1

## 2014-03-17 MED ORDER — ONDANSETRON HCL 4 MG/2ML IJ SOLN
4.0000 mg | Freq: Once | INTRAMUSCULAR | Status: AC
  Administered 2014-03-17: 4 mg via INTRAVENOUS
  Filled 2014-03-17: qty 2

## 2014-03-17 MED ORDER — CALCIUM CARBONATE-VITAMIN D 500-200 MG-UNIT PO TABS
1.0000 | ORAL_TABLET | Freq: Every day | ORAL | Status: DC
  Administered 2014-03-19 – 2014-03-20 (×2): 1 via ORAL
  Filled 2014-03-17 (×4): qty 1

## 2014-03-17 MED ORDER — ALPRAZOLAM 0.5 MG PO TABS
0.5000 mg | ORAL_TABLET | Freq: Every evening | ORAL | Status: DC | PRN
  Administered 2014-03-19: 0.5 mg via ORAL
  Filled 2014-03-17: qty 1

## 2014-03-17 MED ORDER — SENNOSIDES-DOCUSATE SODIUM 8.6-50 MG PO TABS: 1.0000 | ORAL_TABLET | Freq: Every evening | ORAL | Status: DC | PRN

## 2014-03-17 MED ORDER — HYDROMORPHONE HCL PF 1 MG/ML IJ SOLN
0.5000 mg | INTRAMUSCULAR | Status: AC | PRN
  Administered 2014-03-17 (×2): 0.5 mg via INTRAVENOUS
  Filled 2014-03-17 (×2): qty 1

## 2014-03-17 MED ORDER — ONDANSETRON HCL 4 MG/2ML IJ SOLN: 4.0000 mg | Freq: Three times a day (TID) | INTRAMUSCULAR | Status: AC | PRN

## 2014-03-17 MED ORDER — POTASSIUM CHLORIDE IN NACL 20-0.45 MEQ/L-% IV SOLN
INTRAVENOUS | Status: DC
  Administered 2014-03-17 – 2014-03-20 (×5): via INTRAVENOUS
  Filled 2014-03-17 (×6): qty 1000

## 2014-03-17 MED ORDER — OMEGA-3-ACID ETHYL ESTERS 1 G PO CAPS
1.0000 g | ORAL_CAPSULE | Freq: Every day | ORAL | Status: DC
  Administered 2014-03-19 – 2014-03-20 (×2): 1 g via ORAL
  Filled 2014-03-17 (×3): qty 1

## 2014-03-17 MED ORDER — LEVOTHYROXINE SODIUM 100 MCG PO TABS
100.0000 ug | ORAL_TABLET | Freq: Every day | ORAL | Status: DC
  Administered 2014-03-18 – 2014-03-20 (×3): 100 ug via ORAL
  Filled 2014-03-17 (×4): qty 1

## 2014-03-17 NOTE — H&P (Signed)
[facility] History and Physical  Kayla FantasiaMargaret G Rames Cohen:[id number]:[id number] DOB: 1929/02/28 DOA: 03/17/2014  Referring physician: ER physician PCP: Kayla HectorNYLAND,LEONARD ROBERT, MD   Chief Complaint: fall  HPI:  80 year old female with past medical history of dementia, dyslipidemia, hypothyroidism who [facility]2014 status post fall. Pt is not a good historian due to history of dementia. Family not at the bedside at this time. No reports of respiratory distress. No reports of vomiting. No blood in stool or urine. No fevers. No loss of consciousness.   In ED, vitals were stable. BP was 141/56, HR 97, T max 98.6 F and oxygen saturation 100%. Blood work showed mild leukocytosis of 11 otherwise unremarkable. X ray of the right hip showed displaced intertrochanteric and subtrochanteric fracture of proximal right femur. CT head was unremarkable.   ED medications: dilaudid 0.5 mg IV and Zofran 4 mg IV.  Assessment & Plan    Active Problems:  Right femoral fracture - management per ortho; surgery likely tonight - keep NPO - continue IV fluids, analgesia as needed - DVT prophylaxis to be addressed after surgery    Dementia - stable   Dyslipidemia - continue omega 3 supplementation    Hypothyroidism - check TSH - continue levothyroxine   DVT prophylaxis: none prior to surgery  Radiological Exams on Admission: Dg Chest 1 View 03/17/2014    IMPRESSION: No active disease.  Low lung volumes.   Electronically Signed   By: [name] M.D.   On: 03/17/2014 13:29   Dg Hip Complete Right 03/17/2014   IMPRESSION: Displaced intertrochanteric and subtrochanteric fracture of proximal right femur. Diffuse osteopenia.   Electronically Signed   By: [name] M.D.   On: 03/17/2014 13:28   Dg Femur Right 03/17/2014   IMPRESSION: Comminuted intertrochanteric fracture proximal right femur.   Electronically Signed   By: [name] M.D.   On: 03/17/2014 13:27   Ct Head Wo Contrast 03/17/2014     IMPRESSION: No acute intracranial abnormality. Stable atrophy and extensive chronic white matter disease.      EKG: sinus tachycardia   Code Status: Full Family Communication: family not at the bedside  Disposition Plan: Admit for further evaluation  Kayla PasseyEVINE, [name], MD  [facility] Pager [phone]  Review of Systems:  Unable to obtain due to history of dementia.  Past Medical History  Diagnosis Date  . Aortic stenosis     Mild to moderate, echo, femoral, 2008 /  mild, echo, March, 2011  . Mitral regurgitation     Mild, echo, November, 2008 /  mild, echo, March, 2011  . Diastolic dysfunction     Echo, 2008  . LVH (left ventricular hypertrophy)     Mild to moderate, echo, March, 2011  . Ejection fraction     65%, echo, 2008 /  70%, echo, March, 2011  . Dyslipidemia   . Hypertension   . Mitral stenosis     Severe left atrial dilatation, moderate mitral annular calcification, possible slight mitral inflow obstruction, echo, March, 2011  . Alzheimer's disease 09/08/2013  . Dyslipidemia   . Diabetes mellitus without complication    Past Surgical History  Procedure Laterality Date  . Abdominal hysterectomy     Social History:  reports that she has never smoked. She has never used smokeless tobacco. She reports that she does not drink alcohol or use illicit drugs.  Allergies  Allergen Reactions  . Sulfonamide Derivatives     REACTION: Facial swelling  Family History:  Family History  Problem Relation Age of Onset  . Diabetes Brother      Prior to Admission medications   Medication Sig Start Date End Date Taking? Authorizing Provider  ALPRAZolam Prudy Feeler(XANAX) 0.5 MG tablet Take 0.5 mg by mouth at bedtime as needed for anxiety.   Yes Historical Provider, MD  calcium-vitamin D (OSCAL WITH D) 500-200 MG-UNIT per tablet Take 1 tablet by mouth daily.     Yes Historical Provider, MD  levothyroxine (SYNTHROID, LEVOTHROID) 100 MCG tablet Take 100 mcg by mouth daily before  breakfast.   Yes Historical Provider, MD  Omega-3 Fatty Acids (FISH OIL) 1200 MG CAPS Take 1 capsule by mouth daily.   Yes Historical Provider, MD   Physical Exam: Filed Vitals:   03/17/14 1119 03/17/14 1347  BP: 160/86 152/84  Pulse: 111 116  Temp: 98.9 F (37.2 C)   TempSrc: Oral   Resp: 20 18  SpO2: 100% 100%    Physical Exam  Constitutional: Appears well-developed and well-nourished. No distress.  HENT: Normocephalic. No tonsillar erythema or exudates Eyes: Conjunctivae are normal. PERRLA, no scleral icterus.  Neck: Normal ROM. Neck supple. No JVD. No thyromegaly.  CVS: RRR, S1/S2 appreciated  Pulmonary: Effort and breath sounds normal, no stridor, rhonchi, wheezes, rales.  Abdominal: Soft. BS +,  no distension, tenderness, rebound or guarding.  Musculoskeletal: no edema, right hip tenderness Lymphadenopathy: No lymphadenopathy noted, cervical, inguinal. Neuro: Alert. No focal neurologic deficits. Skin: Skin is warm and dry.  Psychiatric: Normal mood and affect. Behavior, judgment, thought content normal.   Labs on Admission:  Basic Metabolic Panel:  Recent Labs Lab 03/17/14 1330  NA 138  K 3.7  CL 99  CO2 23  GLUCOSE 213*  BUN 9  CREATININE 0.56  CALCIUM 9.0   Liver Function Tests: No results found for this basename: AST, ALT, ALKPHOS, BILITOT, PROT, ALBUMIN,  in the last 168 hours No results found for this basename: LIPASE, AMYLASE,  in the last 168 hours No results found for this basename: AMMONIA,  in the last 168 hours CBC:  Recent Labs Lab 03/17/14 1330  WBC 11.0*  NEUTROABS 8.8*  HGB 12.2  HCT 35.9*  MCV 99.4  PLT 244   Cardiac Enzymes: No results found for this basename: CKTOTAL, CKMB, CKMBINDEX, TROPONINI,  in the last 168 hours BNP: No components found with this basename: POCBNP,  CBG: No results found for this basename: GLUCAP,  in the last 168 hours  If 7PM-7AM, please contact night-coverage [email or web address] Password [email or web address] 03/17/2014,  3:37 PM

## 2014-03-17 NOTE — Consult Note (Addendum)
[facility] Consultation  Reason for Consult: R peritroch hip fracture Referring Physician: Dorie Rank, MD  [name] is an 80 y.o. female.  HPI: Patient in ED w/o known mechanism of injury but presumed fall.  Smiling and pleasant but in large part unable to communicate. Family is not in room currently and reportedly I just missed them.  Past Medical History  Diagnosis Date  . Aortic stenosis     Mild to moderate, echo, femoral, 2008 /  mild, echo, March, 2011  . Mitral regurgitation     Mild, echo, November, 2008 /  mild, echo, March, 2011  . Diastolic dysfunction     Echo, 2008  . LVH (left ventricular hypertrophy)     Mild to moderate, echo, March, 2011  . Ejection fraction     65%, echo, 2008 /  70%, echo, March, 2011  . Dyslipidemia   . Hypertension   . Mitral stenosis     Severe left atrial dilatation, moderate mitral annular calcification, possible slight mitral inflow obstruction, echo, March, 2011  . Alzheimer's disease 09/08/2013  . Dyslipidemia   . Diabetes mellitus without complication     Past Surgical History  Procedure Laterality Date  . Abdominal hysterectomy      Family History  Problem Relation Age of Onset  . Diabetes Brother     Social History:  reports that she has never smoked. She has never used smokeless tobacco. She reports that she does not drink alcohol or use illicit drugs.  Allergies:  Allergies  Allergen Reactions  . Sulfonamide Derivatives     REACTION: Facial swelling    Medications: I do not have any medication information available at this time.    Results for orders placed during the hospital encounter of 03/17/14 (from the past 48 hour(s))  ABO/RH     Status: None   Collection Time    03/17/14 11:46 AM      Result Value Ref Range   ABO/RH(D) A POS    BASIC METABOLIC PANEL     Status: Abnormal   Collection Time    03/17/14  1:30 PM      Result Value Ref Range   Sodium 138  137 - 147 mEq/L   Potassium  3.7  3.7 - 5.3 mEq/L   Chloride 99  96 - 112 mEq/L   CO2 23  19 - 32 mEq/L   Glucose, Bld 213 (*) 70 - 99 mg/dL   BUN 9  6 - 23 mg/dL   Creatinine, Ser 0.56  0.50 - 1.10 mg/dL   Calcium 9.0  8.4 - 10.5 mg/dL   GFR calc non Af Amer 85 (*) >90 mL/min   GFR calc Af Amer >90  >90 mL/min   Comment: (NOTE)     The eGFR has been calculated using the CKD EPI equation.     This calculation has not been validated in all clinical situations.     eGFR's persistently <90 mL/min signify possible Chronic Kidney     Disease.  CBC WITH DIFFERENTIAL     Status: Abnormal   Collection Time    03/17/14  1:30 PM      Result Value Ref Range   WBC 11.0 (*) 4.0 - 10.5 K/uL   RBC 3.61 (*) 3.87 - 5.11 MIL/uL   Hemoglobin 12.2  12.0 - 15.0 g/dL   HCT 35.9 (*) 36.0 - 46.0 %   MCV 99.4  78.0 - 100.0 fL   MCH 33.8  26.0 - 34.0 pg   MCHC 34.0  30.0 - 36.0 g/dL   RDW 14.3  11.5 - 15.5 %   Platelets 244  150 - 400 K/uL   Neutrophils Relative % 81 (*) 43 - 77 %   Neutro Abs 8.8 (*) 1.7 - 7.7 K/uL   Lymphocytes Relative 9 (*) 12 - 46 %   Lymphs Abs 1.0  0.7 - 4.0 K/uL   Monocytes Relative 10  3 - 12 %   Monocytes Absolute 1.1 (*) 0.1 - 1.0 K/uL   Eosinophils Relative 0  0 - 5 %   Eosinophils Absolute 0.0  0.0 - 0.7 K/uL   Basophils Relative 0  0 - 1 %   Basophils Absolute 0.0  0.0 - 0.1 K/uL  PROTIME-INR     Status: None   Collection Time    03/17/14  1:30 PM      Result Value Ref Range   Prothrombin Time 14.6  11.6 - 15.2 seconds   INR 1.14  0.00 - 1.49  TYPE AND SCREEN     Status: None   Collection Time    03/17/14  1:30 PM      Result Value Ref Range   ABO/RH(D) A POS     Antibody Screen NEG     Sample Expiration 03/20/2014      Dg Chest 1 View  03/17/2014   CLINICAL DATA:  Pain post fall  EXAM: CHEST - 1 VIEW  COMPARISON:  None.  FINDINGS: Cardiomediastinal silhouette is unremarkable. No acute infiltrate or pleural effusion. Low lung volumes. Bony thorax is unremarkable.  IMPRESSION: No active  disease.  Low lung volumes.   Electronically Signed   By: Lahoma Crocker M.D.   On: 03/17/2014 13:29   Dg Hip Complete Right  03/17/2014   CLINICAL DATA:  Pain post fall  EXAM: RIGHT HIP - COMPLETE 2+ VIEW  COMPARISON:  None.  FINDINGS: Two views of the right hip submitted. Diffuse osteopenia. There is a displaced intertrochanteric and subtrochanteric fracture of the proximal right femur. There is medial displacement of lesser trochanter fragment.  IMPRESSION: Displaced intertrochanteric and subtrochanteric fracture of proximal right femur. Diffuse osteopenia.   Electronically Signed   By: Lahoma Crocker M.D.   On: 03/17/2014 13:28   Dg Femur Right  03/17/2014   CLINICAL DATA:  Patient complains of right hip and femur pain status post fall.  EXAM: RIGHT FEMUR - 2 VIEW  COMPARISON:  03/17/2014.  FINDINGS: There is a comminuted oblique fracture through the intertrochanteric region of the proximal right femur. No definite evidence for associated acute fractures.  IMPRESSION: Comminuted intertrochanteric fracture proximal right femur.   Electronically Signed   By: Lovey Newcomer M.D.   On: 03/17/2014 13:27   Ct Head Wo Contrast  03/17/2014   CLINICAL DATA:  Leg pain post fall  EXAM: CT HEAD WITHOUT CONTRAST  TECHNIQUE: Contiguous axial images were obtained from the base of the skull through the vertex without intravenous contrast.  COMPARISON:  09/11/2013  FINDINGS: No skull fracture is noted. The mastoid air cells are unremarkable. Stable atrophy and chronic white matter disease. There is mucosal thickening with partial opacification right maxillary sinus. Small mucous retention cyst left maxillary sinus.  No acute cortical infarction. No mass lesion is noted on this unenhanced scan. Atherosclerotic calcifications of carotid siphon.  IMPRESSION: No acute intracranial abnormality. Stable atrophy and extensive chronic white matter disease.   Electronically Signed   By: Orlean Bradford.D.  On: 03/17/2014 13:30     ROS Blood pressure 152/84, pulse 116, temperature 98.9 F (37.2 C), temperature source Oral, resp. rate 18, SpO2 100.00%. Physical Exam Smiling, pleasant, occasionally leans head back and closes eyes Abdomen obese, soft No wheezing RUEx shoulder, elbow, wrist, digits- no skin wounds, nontender, no instability, no blocks to motion  Sens  Ax/R/M/U intact  Mot   Ax/ R/ PIN/ M/ AIN/ U intact  Rad 2+ LUEx shoulder, elbow, wrist, digits- no skin wounds, nontender, no instability, no blocks to motion  Sens  Ax/R/M/U intact  Mot   Ax/ R/ PIN/ M/ AIN/ U intact  Rad 2+ LLE No traumatic wounds, ecchymosis, or rash  Nontender  No effusions  Knee stable to varus/ valgus and anterior/posterior stress  Sens cannot not confirm  Motor EHL, ext, flex, evers intact  DP 2+, No significant edema RLE Severe shortening and external rotation of the RLE  No traumatic wounds, ecchymosis, or rash  Tender right hip  No effusions  Knee stable to varus/ valgus and anterior/posterior stress  Sens cannot not confirm  Motor EHL, ext, flex, evers 5/5  DP 2+, No significant edema   Assessment/Plan: R intertroch, subtroch hip fracture, severe shortening and rotation  Plan for IMN as soon as feasible Significant cardiac history with last ECHO apparently in 2011, may need reassessment preop OR has room availability at after 6:45pm tonight. Will discuss with family.   Altamese Starke, MD [facility], [facility] [phone] (p)   03/17/2014  3:29 PM   ADDENDUM: Spoke with nephew, [name], who is the POA and with whom, along with his wife, [name] has lived.  She has no children.  ECHO has been ordered.  I discussed with the patient's POA, [name], the risks and benefits of surgery for R hip fracture repair, including the possibility of infection, nerve injury, vessel injury, malunion, nonunion, wound breakdown, arthritis, symptomatic hardware, DVT/ PE, loss of  motion, and need for further surgery among others.  We also specifically discussed the elevated risk of heart attack or stroke given her cardiac dysfunction. He understood these risks and wished to proceed.   Altamese Oxford, MD [facility] [phone] (p)

## 2014-03-17 NOTE — ED Provider Notes (Signed)
CSN: [id number]     Arrival date & time 03/17/14  1115 History   First MD Initiated Contact with Patient 03/17/14 1122     Chief Complaint  Patient presents with  . Fall   Level V caveat: Dementia HPI Patient presents to the emergency room for evaluation of right leg pain after a fall yesterday. The history is limited by the patient's dementia. She is alert, smiling and pleasant however she is unable to tell me why she is here in the emergency department. She does admit that her right leg hurts when I asked her about having any pain there. She does not know how long it has been hurting or why it is hurting.  She is not sure if she fell.  She cannot tell me anything more about.  According to records she has dementia.  Pt per records is normally able to walk.  Past Medical History  Diagnosis Date  . Aortic stenosis     Mild to moderate, echo, femoral, 2008 /  mild, echo, March, 2011  . Mitral regurgitation     Mild, echo, November, 2008 /  mild, echo, March, 2011  . Diastolic dysfunction     Echo, 2008  . LVH (left ventricular hypertrophy)     Mild to moderate, echo, March, 2011  . Ejection fraction     65%, echo, 2008 /  70%, echo, March, 2011  . Dyslipidemia   . Hypertension   . Mitral stenosis     Severe left atrial dilatation, moderate mitral annular calcification, possible slight mitral inflow obstruction, echo, March, 2011  . Alzheimer's disease 09/08/2013  . Dyslipidemia   . Diabetes mellitus without complication    Past Surgical History  Procedure Laterality Date  . Abdominal hysterectomy     Family History  Problem Relation Age of Onset  . Diabetes Brother    History  Substance Use Topics  . Smoking status: Never Smoker   . Smokeless tobacco: Never Used  . Alcohol Use: No   OB History   Grav Para Term Preterm Abortions TAB SAB Ect Mult Living                 Review of Systems  All other systems reviewed and are negative.     Allergies  Sulfonamide  derivatives  Home Medications   Prior to Admission medications   Medication Sig Start Date End Date Taking? Authorizing Ramonica Grigg  ALPRAZolam Prudy Feeler(XANAX) 0.5 MG tablet Take 0.5 mg by mouth at bedtime as needed for anxiety.   Yes Historical Marquetta Weiskopf, MD  calcium-vitamin D (OSCAL WITH D) 500-200 MG-UNIT per tablet Take 1 tablet by mouth daily.     Yes Historical Chardonay Scritchfield, MD  levothyroxine (SYNTHROID, LEVOTHROID) 100 MCG tablet Take 100 mcg by mouth daily before breakfast.   Yes Historical Becker Christopher, MD  Omega-3 Fatty Acids (FISH OIL) 1200 MG CAPS Take 1 capsule by mouth daily.   Yes Historical Izeah Vossler, MD   BP 152/84  Pulse 116  Temp(Src) 98.9 F (37.2 C) (Oral)  Resp 18  SpO2 100% Physical Exam  Nursing note and vitals reviewed. Constitutional: She appears well-developed and well-nourished. No distress.  HENT:  Head: Normocephalic and atraumatic.  Right Ear: External ear normal.  Left Ear: External ear normal.  Eyes: Conjunctivae are normal. Right eye exhibits no discharge. Left eye exhibits no discharge. No scleral icterus.  Neck: Neck supple. No tracheal deviation present.  Cardiovascular: Normal rate, regular rhythm and intact distal pulses.   Pulmonary/Chest: Effort  normal and breath sounds normal. No stridor. No respiratory distress. She has no wheezes. She has no rales.  Abdominal: Soft. Bowel sounds are normal. She exhibits no distension. There is no tenderness. There is no rebound and no guarding.  Musculoskeletal: She exhibits edema and tenderness.       Right hip: She exhibits decreased range of motion, tenderness, bony tenderness and swelling.       Right upper leg: She exhibits tenderness, bony tenderness, swelling and edema.  Right lower extremity is externally rotated, knee is flexed  Neurological: She is alert. She has normal strength. No cranial nerve deficit (no facial droop, extraocular movements intact, no slurred speech) or sensory deficit. She exhibits normal muscle  tone. She displays no seizure activity. Coordination normal.  Skin: Skin is warm and dry. No rash noted.  Psychiatric: She has a normal mood and affect.    ED Course  Procedures (including critical care time) Labs Review Labs Reviewed  BASIC METABOLIC PANEL - Abnormal; Notable for the following:    Glucose, Bld 213 (*)    GFR calc non Af Amer 85 (*)    All other components within normal limits  CBC WITH DIFFERENTIAL - Abnormal; Notable for the following:    WBC 11.0 (*)    RBC 3.61 (*)    HCT 35.9 (*)    Neutrophils Relative % 81 (*)    Neutro Abs 8.8 (*)    Lymphocytes Relative 9 (*)    Monocytes Absolute 1.1 (*)    All other components within normal limits  PROTIME-INR  TYPE AND SCREEN  ABO/RH    Imaging Review Dg Chest 1 View  03/17/2014   CLINICAL DATA:  Pain post fall  EXAM: CHEST - 1 VIEW  COMPARISON:  None.  FINDINGS: Cardiomediastinal silhouette is unremarkable. No acute infiltrate or pleural effusion. Low lung volumes. Bony thorax is unremarkable.  IMPRESSION: No active disease.  Low lung volumes.   Electronically Signed   By: Natasha Mead M.D.   On: 03/17/2014 13:29   Dg Hip Complete Right  03/17/2014   CLINICAL DATA:  Pain post fall  EXAM: RIGHT HIP - COMPLETE 2+ VIEW  COMPARISON:  None.  FINDINGS: Two views of the right hip submitted. Diffuse osteopenia. There is a displaced intertrochanteric and subtrochanteric fracture of the proximal right femur. There is medial displacement of lesser trochanter fragment.  IMPRESSION: Displaced intertrochanteric and subtrochanteric fracture of proximal right femur. Diffuse osteopenia.   Electronically Signed   By: Natasha Mead M.D.   On: 03/17/2014 13:28   Dg Femur Right  03/17/2014   CLINICAL DATA:  Patient complains of right hip and femur pain status post fall.  EXAM: RIGHT FEMUR - 2 VIEW  COMPARISON:  03/17/2014.  FINDINGS: There is a comminuted oblique fracture through the intertrochanteric region of the proximal right femur. No  definite evidence for associated acute fractures.  IMPRESSION: Comminuted intertrochanteric fracture proximal right femur.   Electronically Signed   By: Annia Belt M.D.   On: 03/17/2014 13:27   Ct Head Wo Contrast  03/17/2014   CLINICAL DATA:  Leg pain post fall  EXAM: CT HEAD WITHOUT CONTRAST  TECHNIQUE: Contiguous axial images were obtained from the base of the skull through the vertex without intravenous contrast.  COMPARISON:  09/11/2013  FINDINGS: No skull fracture is noted. The mastoid air cells are unremarkable. Stable atrophy and chronic white matter disease. There is mucosal thickening with partial opacification right maxillary sinus. Small mucous retention cyst  left maxillary sinus.  No acute cortical infarction. No mass lesion is noted on this unenhanced scan. Atherosclerotic calcifications of carotid siphon.  IMPRESSION: No acute intracranial abnormality. Stable atrophy and extensive chronic white matter disease.   Electronically Signed   By: Natasha MeadLiviu  Pop M.D.   On: 03/17/2014 13:30     EKG Interpretation   Date/Time:  Saturday March 17 2014 11:57:00 EDT Ventricular Rate:  109 PR Interval:  174 QRS Duration: 90 QT Interval:  341 QTC Calculation: 459 R Axis:   -10 Text Interpretation:  Sinus tachycardia Abnormal R-wave progression, late  transition Inferior infarct, old No previous tracing Confirmed by KNAPP   MD-J, JON ([id number]) on 03/17/2014 12:04:32 PM      MDM   Final diagnoses:  Hip fracture, right, closed, initial encounter    I discussed the findings with the patient and her family. Patient has a comminuted intratrochanteric proximal right femur fracture. I consulted with tried hospitalist, and orthopedics. Dr. Carola FrostHandy and Dr. Elisabeth Pigeonevine will be coming to evaluate the patient.  Will keep patient NPO.  Anticipate surgery later today    Linwood DibblesJon Knapp, MD 03/17/14 1521

## 2014-03-17 NOTE — ED Notes (Signed)
Pt from home with c/o of right leg pain from fall yesterday. Increased pain upon movement 10/10. Normally walks without assistance.

## 2014-03-17 NOTE — Anesthesia Preprocedure Evaluation (Addendum)
Anesthesia Evaluation  Patient identified by MRN, date of birth, ID band Patient awake    Reviewed: Allergy & Precautions, H&P , NPO status , Patient's Chart, lab work & pertinent test results  Airway Mallampati: II TM Distance: >3 FB Neck ROM: full    Dental  (+) Edentulous Upper, Edentulous Lower   Pulmonary neg pulmonary ROS,  breath sounds clear to auscultation  Pulmonary exam normal       Cardiovascular Exercise Tolerance: Poor hypertension, + Valvular Problems/Murmurs AS and MR Rhythm:regular Rate:Normal  Diastolic dysfunction. Severe left atrial dilation due to mitral valve disease. LVH.  Moderate AS in 2011   Neuro/Psych Alzheimer's negative neurological ROS  negative psych ROS   GI/Hepatic negative GI ROS, Neg liver ROS,   Endo/Other  diabetes, Well Controlled, Type 2Diet controlled DM  Renal/GU negative Renal ROS  negative genitourinary   Musculoskeletal   Abdominal   Peds  Hematology negative hematology ROS (+)   Anesthesia Other Findings   Reproductive/Obstetrics negative OB ROS                          Anesthesia Physical Anesthesia Plan  ASA: IV  Anesthesia Plan: General   Post-op Pain Management:    Induction: Intravenous  Airway Management Planned: Oral ETT  Additional Equipment:   Intra-op Plan:   Post-operative Plan: Extubation in OR  Informed Consent: I have reviewed the patients History and Physical, chart, labs and discussed the procedure including the risks, benefits and alternatives for the proposed anesthesia with the patient or authorized representative who has indicated his/her understanding and acceptance.   Dental Advisory Given  Plan Discussed with: CRNA and Surgeon  Anesthesia Plan Comments:         Anesthesia Quick Evaluation

## 2014-03-17 NOTE — ED Notes (Signed)
SPO2 dropped to 84%.  Switched out cable and continued to monitor.  Readings stayed in the 80s.  Placed on 2 liters [place].  Current O2 99%

## 2014-03-18 ENCOUNTER — Encounter (HOSPITAL_COMMUNITY)
Admission: EM | Disposition: A | Discharge status: Skilled Nursing Facility | Payer: Self-pay | Source: Home / Self Care | Attending: Internal Medicine

## 2014-03-18 ENCOUNTER — Encounter (HOSPITAL_COMMUNITY): Payer: Self-pay | Admitting: Certified Registered"

## 2014-03-18 ENCOUNTER — Encounter (HOSPITAL_COMMUNITY): Payer: Self-pay | Admitting: Anesthesiology

## 2014-03-18 ENCOUNTER — Encounter (HOSPITAL_COMMUNITY): Payer: Medicare HMO | Admitting: Anesthesiology

## 2014-03-18 ENCOUNTER — Inpatient Hospital Stay (HOSPITAL_COMMUNITY): Payer: Medicare HMO

## 2014-03-18 ENCOUNTER — Inpatient Hospital Stay (HOSPITAL_COMMUNITY): Payer: Medicare HMO | Admitting: Anesthesiology

## 2014-03-18 DIAGNOSIS — N390 Urinary tract infection, site not specified: Secondary | ICD-10-CM | POA: Diagnosis present

## 2014-03-18 HISTORY — PX: FEMUR IM NAIL: SHX1597

## 2014-03-18 LAB — BASIC METABOLIC PANEL
BUN: 13 mg/dL (ref 6–23)
CALCIUM: 8.4 mg/dL (ref 8.4–10.5)
CO2: 26 meq/L (ref 19–32)
Chloride: 100 mEq/L (ref 96–112)
Creatinine, Ser: 0.59 mg/dL (ref 0.50–1.10)
GFR calc Af Amer: >90 mL/min (ref >90)
GFR, EST NON AFRICAN AMERICAN: 83 mL/min — AB (ref >90)
GLUCOSE: 247 mg/dL — AB (ref 70–99)
Potassium: 4.3 mEq/L (ref 3.7–5.3)
Sodium: 137 mEq/L (ref 137–147)

## 2014-03-18 LAB — CBC
HEMATOCRIT: 32.5 % — AB (ref 36.0–46.0)
HEMOGLOBIN: 11.1 g/dL — AB (ref 12.0–15.0)
MCH: 34.5 pg — AB (ref 26.0–34.0)
MCHC: 34.2 g/dL (ref 30.0–36.0)
MCV: 100.9 fL — ABNORMAL HIGH (ref 78.0–100.0)
Platelets: 254 10*3/uL (ref 150–400)
RBC: 3.22 MIL/uL — ABNORMAL LOW (ref 3.87–5.11)
RDW: 14.3 % (ref 11.5–15.5)
WBC: 12.9 10*3/uL — ABNORMAL HIGH (ref 4.0–10.5)

## 2014-03-18 LAB — TSH: TSH: 0.376 u[IU]/mL (ref 0.350–4.500)

## 2014-03-18 LAB — GLUCOSE, CAPILLARY: Glucose-Capillary: 199 mg/dL — ABNORMAL HIGH (ref 70–99)

## 2014-03-18 SURGERY — INSERTION, INTRAMEDULLARY ROD, FEMUR
Anesthesia: General | Site: Hip | Laterality: Right

## 2014-03-18 MED ORDER — DOCUSATE SODIUM 100 MG PO CAPS
100.0000 mg | ORAL_CAPSULE | Freq: Two times a day (BID) | ORAL | Status: DC
  Administered 2014-03-19 – 2014-03-20 (×2): 100 mg via ORAL

## 2014-03-18 MED ORDER — HYDROMORPHONE HCL PF 1 MG/ML IJ SOLN
1.0000 mg | INTRAMUSCULAR | Status: DC | PRN
  Administered 2014-03-18 – 2014-03-19 (×2): 1 mg via INTRAVENOUS
  Filled 2014-03-18 (×2): qty 1

## 2014-03-18 MED ORDER — PROPOFOL 10 MG/ML IV BOLUS
INTRAVENOUS | Status: DC | PRN
  Administered 2014-03-18: 100 mg via INTRAVENOUS

## 2014-03-18 MED ORDER — CEFAZOLIN SODIUM-DEXTROSE 2-3 GM-% IV SOLR
2.0000 g | Freq: Four times a day (QID) | INTRAVENOUS | Status: AC
  Administered 2014-03-18 – 2014-03-19 (×2): 2 g via INTRAVENOUS
  Filled 2014-03-18 (×2): qty 50

## 2014-03-18 MED ORDER — MAGNESIUM HYDROXIDE 400 MG/5ML PO SUSP: 30.0000 mL | Freq: Every day | ORAL | Status: DC | PRN

## 2014-03-18 MED ORDER — PHENYLEPHRINE HCL 10 MG/ML IJ SOLN
INTRAMUSCULAR | Status: DC | PRN
  Administered 2014-03-18 (×2): 80 ug via INTRAVENOUS

## 2014-03-18 MED ORDER — MENTHOL 3 MG MT LOZG
1.0000 | LOZENGE | OROMUCOSAL | Status: DC | PRN
  Filled 2014-03-18: qty 9

## 2014-03-18 MED ORDER — HYDROMORPHONE HCL PF 1 MG/ML IJ SOLN
0.2500 mg | INTRAMUSCULAR | Status: DC | PRN
  Administered 2014-03-18 (×4): 0.5 mg via INTRAVENOUS

## 2014-03-18 MED ORDER — ACETAMINOPHEN 650 MG RE SUPP: 650.0000 mg | Freq: Four times a day (QID) | RECTAL | Status: DC | PRN

## 2014-03-18 MED ORDER — ROCURONIUM BROMIDE 100 MG/10ML IV SOLN
INTRAVENOUS | Status: DC | PRN
  Administered 2014-03-18: 30 mg via INTRAVENOUS

## 2014-03-18 MED ORDER — HYDROMORPHONE HCL PF 1 MG/ML IJ SOLN
INTRAMUSCULAR | Status: AC
  Filled 2014-03-18: qty 1

## 2014-03-18 MED ORDER — LACTATED RINGERS IV SOLN: INTRAVENOUS | Status: DC

## 2014-03-18 MED ORDER — ENOXAPARIN SODIUM 40 MG/0.4ML ~~LOC~~ SOLN
40.0000 mg | SUBCUTANEOUS | Status: DC
  Administered 2014-03-19 – 2014-03-20 (×2): 40 mg via SUBCUTANEOUS
  Filled 2014-03-18 (×3): qty 0.4

## 2014-03-18 MED ORDER — ACETAMINOPHEN 325 MG PO TABS
650.0000 mg | ORAL_TABLET | Freq: Four times a day (QID) | ORAL | Status: DC | PRN
  Administered 2014-03-18 – 2014-03-20 (×2): 650 mg via ORAL
  Filled 2014-03-18: qty 2

## 2014-03-18 MED ORDER — PHENOL 1.4 % MT LIQD: 1.0000 | OROMUCOSAL | Status: DC | PRN

## 2014-03-18 MED ORDER — DEXTROSE 5 % IV SOLN
1.0000 g | Freq: Every day | INTRAVENOUS | Status: DC
  Administered 2014-03-18 – 2014-03-20 (×4): 1 g via INTRAVENOUS
  Filled 2014-03-18 (×4): qty 10

## 2014-03-18 MED ORDER — 0.9 % SODIUM CHLORIDE (POUR BTL) OPTIME
TOPICAL | Status: DC | PRN
  Administered 2014-03-18: 1000 mL

## 2014-03-18 MED ORDER — ONDANSETRON HCL 4 MG/2ML IJ SOLN
INTRAMUSCULAR | Status: AC
  Filled 2014-03-18: qty 2

## 2014-03-18 MED ORDER — OXYCODONE HCL 5 MG PO TABS
5.0000 mg | ORAL_TABLET | ORAL | Status: DC | PRN
  Administered 2014-03-18 (×2): 5 mg via ORAL
  Administered 2014-03-19: 10 mg via ORAL
  Filled 2014-03-18: qty 2
  Filled 2014-03-18 (×2): qty 1

## 2014-03-18 MED ORDER — METOCLOPRAMIDE HCL 10 MG PO TABS: 5.0000 mg | ORAL_TABLET | Freq: Three times a day (TID) | ORAL | Status: DC | PRN

## 2014-03-18 MED ORDER — FENTANYL CITRATE 0.05 MG/ML IJ SOLN
INTRAMUSCULAR | Status: AC
  Filled 2014-03-18: qty 2

## 2014-03-18 MED ORDER — ONDANSETRON HCL 4 MG PO TABS: 4.0000 mg | ORAL_TABLET | Freq: Four times a day (QID) | ORAL | Status: DC | PRN

## 2014-03-18 MED ORDER — PROPOFOL 10 MG/ML IV BOLUS
INTRAVENOUS | Status: AC
  Filled 2014-03-18: qty 20

## 2014-03-18 MED ORDER — METOCLOPRAMIDE HCL 5 MG/ML IJ SOLN: 5.0000 mg | Freq: Three times a day (TID) | INTRAMUSCULAR | Status: DC | PRN

## 2014-03-18 MED ORDER — ONDANSETRON HCL 4 MG/2ML IJ SOLN
INTRAMUSCULAR | Status: DC | PRN
  Administered 2014-03-18: 4 mg via INTRAVENOUS

## 2014-03-18 MED ORDER — FENTANYL CITRATE 0.05 MG/ML IJ SOLN
INTRAMUSCULAR | Status: DC | PRN
  Administered 2014-03-18 (×2): 50 ug via INTRAVENOUS

## 2014-03-18 MED ORDER — ONDANSETRON HCL 4 MG/2ML IJ SOLN: 4.0000 mg | Freq: Four times a day (QID) | INTRAMUSCULAR | Status: DC | PRN

## 2014-03-18 MED ORDER — LACTATED RINGERS IV SOLN
INTRAVENOUS | Status: DC | PRN
  Administered 2014-03-18 (×2): via INTRAVENOUS

## 2014-03-18 MED ORDER — SUCCINYLCHOLINE CHLORIDE 20 MG/ML IJ SOLN
INTRAMUSCULAR | Status: DC | PRN
  Administered 2014-03-18: 80 mg via INTRAVENOUS

## 2014-03-18 MED ORDER — PHENYLEPHRINE 40 MCG/ML (10ML) SYRINGE FOR IV PUSH (FOR BLOOD PRESSURE SUPPORT)
PREFILLED_SYRINGE | INTRAVENOUS | Status: AC
  Filled 2014-03-18: qty 10

## 2014-03-18 SURGICAL SUPPLY — 43 items
BAG ZIPLOCK 12X15 (MISCELLANEOUS) ×3 IMPLANT
BIT DRILL AO GAMMA 4.2X180 (BIT) ×3 IMPLANT
BNDG COHESIVE 4X5 TAN STRL (GAUZE/BANDAGES/DRESSINGS) ×3 IMPLANT
BNDG GAUZE ELAST 4 BULKY (GAUZE/BANDAGES/DRESSINGS) ×3 IMPLANT
CANISTER SUCTION 2500CC (MISCELLANEOUS) ×3 IMPLANT
COVER SURGICAL LIGHT HANDLE (MISCELLANEOUS) ×3 IMPLANT
DRAPE LG THREE QUARTER DISP (DRAPES) ×3 IMPLANT
DRAPE ORTHO SPLIT 77X108 STRL (DRAPES) ×2
DRAPE POUCH INSTRU U-SHP 10X18 (DRAPES) ×3 IMPLANT
DRAPE SURG 17X11 SM STRL (DRAPES) ×6 IMPLANT
DRAPE SURG ORHT 6 SPLT 77X108 (DRAPES) ×1 IMPLANT
DRSG ADAPTIC 3X8 NADH LF (GAUZE/BANDAGES/DRESSINGS) ×3 IMPLANT
DRSG MEPILEX BORDER 4X4 (GAUZE/BANDAGES/DRESSINGS) ×3 IMPLANT
DRSG MEPILEX BORDER 4X8 (GAUZE/BANDAGES/DRESSINGS) ×3 IMPLANT
DRSG PAD ABDOMINAL 8X10 ST (GAUZE/BANDAGES/DRESSINGS) ×3 IMPLANT
ELECT REM PT RETURN 9FT ADLT (ELECTROSURGICAL) ×3
ELECTRODE REM PT RTRN 9FT ADLT (ELECTROSURGICAL) ×1 IMPLANT
GAUZE SPONGE 4X4 12PLY STRL (GAUZE/BANDAGES/DRESSINGS) ×3 IMPLANT
GLOVE BIO SURGEON STRL SZ7.5 (GLOVE) ×3 IMPLANT
GLOVE BIO SURGEON STRL SZ8 (GLOVE) ×3 IMPLANT
GLOVE ECLIPSE 7.5 STRL STRAW (GLOVE) ×3 IMPLANT
GUIDEROD T2 3X1000 (ROD) ×3 IMPLANT
K-WIRE  3.2X450M STR (WIRE) ×2
K-WIRE 3.2X450M STR (WIRE) ×1
KIT BASIN OR (CUSTOM PROCEDURE TRAY) ×3 IMPLANT
KWIRE 3.2X450M STR (WIRE) ×1 IMPLANT
MANIFOLD NEPTUNE II (INSTRUMENTS) ×3 IMPLANT
NAIL LONG HIP 011X360MMX125D (Nail) ×3 IMPLANT
NS IRRIG 1000ML POUR BTL (IV SOLUTION) ×3 IMPLANT
PACK TOTAL JOINT (CUSTOM PROCEDURE TRAY) ×3 IMPLANT
POSITIONER SURGICAL ARM (MISCELLANEOUS) ×3 IMPLANT
REAMER SHAFT BIXCUT (INSTRUMENTS) ×3 IMPLANT
SCREW LAG GAMMA 3 95MM (Screw) ×3 IMPLANT
SCREW LOCKING T2 F/T  5MMX45MM (Screw) ×2 IMPLANT
SCREW LOCKING T2 F/T 5MMX45MM (Screw) ×1 IMPLANT
SOL PREP POV-IOD 16OZ 10% (MISCELLANEOUS) ×3 IMPLANT
SOL PREP PROV IODINE SCRUB 4OZ (MISCELLANEOUS) ×3 IMPLANT
STAPLER VISISTAT 35W (STAPLE) ×3 IMPLANT
SUT MNCRL AB 4-0 PS2 18 (SUTURE) ×3 IMPLANT
SUT VIC AB 1 CT1 36 (SUTURE) ×6 IMPLANT
SUT VIC AB 2-0 CT1 27 (SUTURE) ×4
SUT VIC AB 2-0 CT1 TAPERPNT 27 (SUTURE) ×2 IMPLANT
WATER STERILE IRR 1500ML POUR (IV SOLUTION) ×3 IMPLANT

## 2014-03-18 NOTE — Anesthesia Postprocedure Evaluation (Signed)
  Anesthesia Post-op Note  Patient: [name]  Procedure(s) Performed: Procedure(s) (LRB): INTRAMEDULLARY (IM) NAIL INTERTROCHANTRIC (Right)  Patient Location: PACU  Anesthesia Type: General  Level of Consciousness: awake and alert   Airway and Oxygen Therapy: Patient Spontanous Breathing  Post-op Pain: mild  Post-op Assessment: Post-op Vital signs reviewed, Patient's Cardiovascular Status Stable, Respiratory Function Stable, Patent Airway and No signs of Nausea or vomiting  Last Vitals:  Filed Vitals:   03/18/14 1639  BP: 145/77  Pulse: 92  Temp: 36.7 C  Resp: 20    Post-op Vital Signs: stable   Complications: No apparent anesthesia complications

## 2014-03-18 NOTE — Progress Notes (Addendum)
Patient ID: Kayla FantasiaMargaret G Cohen, female   DOB: [birth year], 80 y.o.   MRN: [id number] [facility] PROGRESS NOTE  [name] [id number] DOB: [birth year] DOA: 03/17/2014 PCP: Josue HectorNYLAND,LEONARD ROBERT, MD  Brief narrative: 80 year old female with past medical history of dementia, dyslipidemia, hypothyroidism who [facility] status post fall. Pt sustained right hip showed displaced intertrochanteric and subtrochanteric fracture of proximal right femur. CT head was unremarkable.   Assessment & Plan   Principal Problem:  Right femoral fracture  - management per ortho; surgery likely tonight  - keep NPO prior to surgery - awaiting 2 D ECHO results prior to surgery. - continue IV fluids, analgesia as needed  - DVT prophylaxis to be addressed after surgery  Active Problems: UTI - would treat for possible UTI since admission UA showed many bacteria, positive leukocytes and nitrites - started rocephin 1 gm daily IV 03/18/2014  - follow up urine culture results  Dementia  - stable  Dyslipidemia  - continue omega 3 supplementation  History of diastolic CHF, chronic - her last 2 D ECHO in 2011 showed essentially normal EF.  - compensated, no LE edema and no shortness of breath or chest pain - follow up 2 D ECHO on this admission - the 12 lead EKG on admission showed sinus tachycardia  Hypothyroidism  - TSH WNL on this admission  - continue levothyroxine   DVT prophylaxis: none prior to surgery   Radiological Exams on Admission:  Dg Chest 1 View  03/17/2014 IMPRESSION: No active disease. Low lung volumes. Electronically Signed By: Natasha MeadLiviu Pop M.D. On: 03/17/2014 13:29   Dg Hip Complete Right  03/17/2014 IMPRESSION: Displaced intertrochanteric and subtrochanteric fracture of proximal right femur. Diffuse osteopenia. Electronically Signed By: Natasha MeadLiviu Pop M.D. On: 03/17/2014 13:28   Dg Femur Right  03/17/2014 IMPRESSION: Comminuted intertrochanteric fracture proximal  right femur. Electronically Signed By: Annia Beltrew Davis M.D. On: 03/17/2014 13:27   Ct Head Wo Contrast  03/17/2014 IMPRESSION: No acute intracranial abnormality. Stable atrophy and extensive chronic white matter disease.   Code Status: Full  Family Communication: family not at the bedside    [name], [name], MD  [facility] Pager [phone]  If 7PM-7AM, please contact night-coverage [email or web address] Password [email or web address] 03/18/2014, 8:53 AM   LOS: 1 day   Consultants:  Orthopedic surgery   Procedures:  None   Antibiotics:  Rocephin 03/18/2014 -->  HPI/Subjective: No acute overnight events.  Objective: Filed Vitals:   03/17/14 2230 03/18/14 0000 03/18/14 0400 03/18/14 0550  BP: 132/80   143/84  Pulse: 91   81  Temp: 98.7 F (37.1 C)   98.3 F (36.8 C)  TempSrc: Oral   Oral  Resp: 18 16 16 18   Height:      SpO2: 100% 100% 100% 100%    Intake/Output Summary (Last 24 hours) at 03/18/14 0853 Last data filed at 03/18/14 [id number]  Gross per 24 hour  Intake 1177.5 ml  Output    575 ml  Net  602.5 ml    Exam:   General:  Pt is alert, follows commands appropriately, not in acute distress  Cardiovascular: Regular rate and rhythm, S1/S2 appreciated   Respiratory: Clear to auscultation bilaterally, no wheezing  Abdomen: Soft, non tender, non distended, bowel sounds present  Extremities: No edema, pulses DP and PT palpable bilaterally  Neuro: Grossly nonfocal  Data Reviewed: Basic Metabolic Panel:  Recent Labs Lab 03/17/14 1330 03/17/14 1756 03/18/14 0627  NA 138 138 137  K 3.7 4.6  4.3  CL 99 102 100  CO2 23 24 26   GLUCOSE 213* 260* 247*  BUN 9 11 13   CREATININE 0.56 0.55 0.59  CALCIUM 9.0 8.7 8.4   Liver Function Tests:  Recent Labs Lab 03/17/14 1756  AST 16  ALT 11  ALKPHOS 59  BILITOT 0.7  PROT 6.4  ALBUMIN 2.9*   No results found for this basename: LIPASE, AMYLASE,  in the last 168 hours No results found for this basename: AMMONIA,  in the last  168 hours CBC:  Recent Labs Lab 03/17/14 1330 03/17/14 1756 03/18/14 0627  WBC 11.0* 12.2* 12.9*  NEUTROABS 8.8*  --   --   HGB 12.2 12.3 11.1*  HCT 35.9* 35.2* 32.5*  MCV 99.4 100.9* 100.9*  PLT 244 233 254   Cardiac Enzymes: No results found for this basename: CKTOTAL, CKMB, CKMBINDEX, TROPONINI,  in the last 168 hours BNP: No components found with this basename: POCBNP,  CBG: No results found for this basename: GLUCAP,  in the last 168 hours  Recent Results (from the past 240 hour(s))  SURGICAL PCR SCREEN     Status: None   Collection Time    03/17/14  8:25 PM      Result Value Ref Range Status   MRSA, PCR NEGATIVE  NEGATIVE Final   Staphylococcus aureus NEGATIVE  NEGATIVE Final     Studies: Dg Chest 1 View 03/17/2014  IMPRESSION: No active disease.  Low lung volumes.   Electronically Signed   By: Natasha MeadLiviu  Pop M.D.   On: 03/17/2014 13:29   Dg Hip Complete Right 03/17/2014    IMPRESSION: Displaced intertrochanteric and subtrochanteric fracture of proximal right femur. Diffuse osteopenia.   Electronically Signed   By: Natasha MeadLiviu  Pop M.D.   On: 03/17/2014 13:28   Dg Femur Right 03/17/2014   IMPRESSION: Comminuted intertrochanteric fracture proximal right femur.   Electronically Signed   By: Annia Beltrew  Davis M.D.   On: 03/17/2014 13:27   Ct Head Wo Contrast 03/17/2014   IMPRESSION: No acute intracranial abnormality. Stable atrophy and extensive chronic white matter disease.   Electronically Signed   By: Natasha MeadLiviu  Pop M.D.   On: 03/17/2014 13:30    Scheduled Meds: . calcium-vitamin D  1 tablet Oral Daily  . levothyroxine  100 mcg Oral QAC breakfast  . omega-3 acid ethyl esters  1 g Oral Daily   Continuous Infusions: . 0.45 % NaCl with KCl 20 mEq / L 75 mL/hr at 03/18/14 0446

## 2014-03-18 NOTE — Transfer of Care (Signed)
Immediate Anesthesia Transfer of Care Note  Patient: [name]  Procedure(s) Performed: Procedure(s) (LRB): INTRAMEDULLARY (IM) NAIL INTERTROCHANTRIC (Right)  Patient Location: PACU  Anesthesia Type: General  Level of Consciousness: sedated, patient cooperative and responds to stimulation  Airway & Oxygen Therapy: Patient Spontanous Breathing and Patient connected to face mask oxgen  Post-op Assessment: Report given to PACU RN and Post -op Vital signs reviewed and stable  Post vital signs: Reviewed and stable  Complications: No apparent anesthesia complications

## 2014-03-18 NOTE — Op Note (Signed)
03/17/2014 - 03/18/2014  3:29 PM  PATIENT:  Kayla FantasiaMargaret G Bress  80 y.o. female  PRE-OPERATIVE DIAGNOSIS:  Right Four Part Intertrochanteric Fracture  POST-OPERATIVE DIAGNOSIS:  Right Four Part Intertrochanteric Fracture  PROCEDURE:  Procedure(s): INTRAMEDULLARY (IM) NAIL INTERTROCHANTRIC (Right) w Gamma 11 x 380 statically locked nail  SURGEON:  Surgeon(s) and Role:    * Budd PalmerMichael H Sharmane Dame, MD - Primary  PHYSICIAN ASSISTANT: None  ANESTHESIA:   general  I/O:  Total I/O In: 1100 [I.V.:1100] Out: 525 [Urine:325; Blood:200]  SPECIMEN:  No Specimen  TOURNIQUET:  * No tourniquets in log *  DICTATION: .Other Dictation: Dictation Number [id number]

## 2014-03-18 NOTE — Op Note (Signed)
NAMMarland Kitchen:  [name], [name]             ACCOUNT NO.:  [id number]  MEDICAL RECORD NO.:  [id number]  LOCATION:  1611                         [facility]  PHYSICIAN:  Doralee AlbinoMichael H. Carola FrostHandy, M.D. DATE OF BIRTH:  06-09-1929  DATE OF PROCEDURE: DATE OF DISCHARGE:                              OPERATIVE REPORT   PREOPERATIVE DIAGNOSIS:  Right 4-part intertrochanteric fracture.  POSTOPERATIVE DIAGNOSIS:  Right 4-part intertrochanteric fracture.  PROCEDURE:  IM nailing of the right hip using a gamma nail, 11 x 380 mm statically locked device.  SURGEON:  Doralee AlbinoMichael H. Carola FrostHandy, M.D.  ASSISTANT:  None.  ANESTHESIA:  General.  COMPLICATIONS:  None.  I/O:  1100 mL crystalloid/UOP 325 mL, EBL 200 mL.  DISPOSITION:  PACU.  CONDITION:  Stable.  INDICATION OF PROCEDURE:  Kayla MilchMargaret Cohen is a very pleasant 80 year old female with dementia who sustained a right hip fracture.  She underwent echocardiogram preoperatively which did not demonstrate significant progression of her known valvular disease.  DESCRIPTION OF PROCEDURE:  She was then taken to operating room where general anesthesia was induced.  She did receive Rocephin for preoperative antibiotics given to UTI.  Standard prep and drape was performed, after the patient was first placed on to the Hana table and positioned to achieve reduction using traction.  A C-arm was brought in to establish the correct starting point for the nail.  Given the patient's size and body habitus, a longer and more proximal incision was required.  Dissection was carried down through the IT band which was split in line with the skin incision.  The tip of the trochanter was identified using curved cannulated awl.  The threaded guidewire was advanced in the proximal femur and the center of the femur.  This was confirmed on orthogonal views.  It was advanced into the center-center position of the distal femur and this was checked on lateral films to make certain that  there was no chance of anterior penetration of the distal cortex.  It was then sequentially reamed to a 13.  A 11 mm x 380 mm nail was then inserted.  I anteriorly translated the shaft relative to the neck and then drove a guidewire across in the center-center position of the head.  This was followed by placement of a lag screw and using the compression device after releasing traction on the leg. Alignment was excellent because of a 3-part fracture, was not anatomic. This was followed by placement of a single locking screw distally. There were no complications during the procedure.  C-arm confirmed appropriate reduction, hardware placement trajectory and length.  Wounds were irrigated thoroughly and closed in standard layered fashion using #1 Vicryl for the IT band proximally, 0 Vicryl for the deep subcu, 2-0 Vicryl and staples for the skin.  Similarly 2-0 Vicryl and staples for the skin for the more distal incisions.  The patient awakened from Anesthesia and transported to the PACU in stable condition.  There were no complications.  PROGNOSIS:  [name] will be weightbearing as tolerated when she is able to mobilize with therapy.  She is at increased risk for perioperative complications because of her cardiac disease and dementia which limits course of her ability to  follow instruction.  She will be on DVT prophylaxis per the Medical Service.  Lovenox is anticipated for a short course.     Doralee AlbinoMichael H. Carola FrostHandy, M.D.     MHH/MEDQ  D:  03/18/2014  T:  03/18/2014  Job:  [id number]

## 2014-03-18 NOTE — Progress Notes (Signed)
ECHO completed at 8:30am but have been unable to touch base with cardiologist reading the study.  Numerous attempts have been made and have explained situation to family, floor nursing, and OR staff who are standing by.  Will proceed when feasible.  Myrene GalasMichael Malky Rudzinski, MD [facility] [phone] (p)

## 2014-03-18 NOTE — Progress Notes (Signed)
Echocardiogram 2D Echocardiogram has been performed.  Estelle GrumblesMyers, Melissa J 03/18/2014, 8:32 AM

## 2014-03-19 DIAGNOSIS — I519 Heart disease, unspecified: Secondary | ICD-10-CM

## 2014-03-19 LAB — BASIC METABOLIC PANEL
BUN: 15 mg/dL (ref 6–23)
CHLORIDE: 99 meq/L (ref 96–112)
CO2: 27 mEq/L (ref 19–32)
CREATININE: 0.61 mg/dL (ref 0.50–1.10)
Calcium: 8.3 mg/dL — ABNORMAL LOW (ref 8.4–10.5)
GFR calc Af Amer: >90 mL/min (ref >90)
GFR calc non Af Amer: 82 mL/min — ABNORMAL LOW (ref >90)
GLUCOSE: 199 mg/dL — AB (ref 70–99)
Potassium: 4.4 mEq/L (ref 3.7–5.3)
Sodium: 136 mEq/L — ABNORMAL LOW (ref 137–147)

## 2014-03-19 LAB — CBC
HEMATOCRIT: 28.1 % — AB (ref 36.0–46.0)
HEMOGLOBIN: 9.5 g/dL — AB (ref 12.0–15.0)
MCH: 34.1 pg — ABNORMAL HIGH (ref 26.0–34.0)
MCHC: 33.8 g/dL (ref 30.0–36.0)
MCV: 100.7 fL — ABNORMAL HIGH (ref 78.0–100.0)
Platelets: 248 10*3/uL (ref 150–400)
RBC: 2.79 MIL/uL — ABNORMAL LOW (ref 3.87–5.11)
RDW: 14.2 % (ref 11.5–15.5)
WBC: 9.4 10*3/uL (ref 4.0–10.5)

## 2014-03-19 MED ORDER — ACETAMINOPHEN 500 MG PO TABS
1000.0000 mg | ORAL_TABLET | Freq: Three times a day (TID) | ORAL | Status: DC
  Administered 2014-03-19 – 2014-03-20 (×3): 1000 mg via ORAL
  Filled 2014-03-19 (×8): qty 2

## 2014-03-19 MED ORDER — ENSURE COMPLETE PO LIQD
237.0000 mL | Freq: Two times a day (BID) | ORAL | Status: DC
  Administered 2014-03-19 – 2014-03-20 (×2): 237 mL via ORAL

## 2014-03-19 MED ORDER — MORPHINE SULFATE 2 MG/ML IJ SOLN
1.0000 mg | INTRAMUSCULAR | Status: DC | PRN
  Administered 2014-03-19: 1 mg via INTRAVENOUS
  Filled 2014-03-19: qty 1

## 2014-03-19 MED ORDER — OXYCODONE HCL 5 MG PO TABS
5.0000 mg | ORAL_TABLET | ORAL | Status: DC | PRN
Start: 2014-03-19 — End: 2014-03-20

## 2014-03-19 MED ORDER — LIP MEDEX EX OINT
TOPICAL_OINTMENT | CUTANEOUS | Status: AC
  Filled 2014-03-19: qty 7

## 2014-03-19 NOTE — Evaluation (Signed)
Physical Therapy Evaluation Patient Details Name: [name] MRN: [id number] DOB: [birth year] Today's Date: 03/19/2014   History of Present Illness  Pt is an 80 year old female with hx of Alzheimers and dementia admitted 6/27 s/p fall at home resulting in R intertrochanteric femur fx and currently s/p R IM nail.  Clinical Impression  Pt admitted with above. Pt currently with functional limitations due to the deficits listed below (see PT Problem List).  Pt will benefit from skilled PT to increase their independence and safety with mobility to allow discharge to the venue listed below.  Pt agreeable to mobility however limited by R LE pain once moving.  Attempted standing x2 today however pt unable.     Follow Up Recommendations SNF    Equipment Recommendations  None recommended by PT    Recommendations for Other Services       Precautions / Restrictions Precautions Precautions: Fall Restrictions Weight Bearing Restrictions: Yes RLE Weight Bearing: Partial weight bearing Other Position/Activity Restrictions: PWB per orders however PA-C note 6/29 states WBAT R LE with no ROM restrictions      Mobility  Bed Mobility Overal bed mobility: +2 for physical assistance;Needs Assistance Bed Mobility: Supine to Sit;Sit to Supine     Supine to sit: +2 for physical assistance;Total assist Sit to supine: +2 for physical assistance;Total assist   General bed mobility comments: pt given mulitmodal cues and time to assist however limited movement due to pain so pt required increased assist  Transfers Overall transfer level: Needs assistance Equipment used: Rolling walker (2 wheeled) Transfers: Sit to/from Stand Sit to Stand: +2 physical assistance;Total assist         General transfer comment: pt unable to stand upright, attempted twice however pt reporting too much pain to stand completely upright  Ambulation/Gait                Stairs            Wheelchair  Mobility    Modified Rankin (Stroke Patients Only)       Balance Overall balance assessment: Needs assistance Sitting-balance support: Bilateral upper extremity supported;Feet supported Sitting balance-Leahy Scale: Poor Sitting balance - Comments: requires bilateral UE support at all times                                     Pertinent Vitals/Pain Premedicated per RN, repositioned in supine    Home Living Family/patient expects to be discharged to:: Skilled nursing facility                      Prior Function           Comments: likely somewhat mobile due to s/p fall, pt with dementia and poor historian, no family present     Hand Dominance        Extremity/Trunk Assessment               Lower Extremity Assessment: RLE deficits/detail;Difficult to assess due to impaired cognition RLE Deficits / Details: required assist for mobility, pt grimacing in pain with R LE movement       Communication   Communication: HOH  Cognition Arousal/Alertness: Awake/alert Behavior During Therapy: WFL for tasks assessed/performed Overall Cognitive Status: History of cognitive impairments - at baseline                      General  Comments      Exercises        Assessment/Plan    PT Assessment Patient needs continued PT services  PT Diagnosis Acute pain;Difficulty walking   PT Problem List Decreased strength;Decreased balance;Decreased mobility;Pain;Decreased knowledge of use of DME  PT Treatment Interventions DME instruction;Gait training;Functional mobility training;Patient/family education;Therapeutic activities;Therapeutic exercise;Wheelchair mobility training   PT Goals (Current goals can be found in the Care Plan section) Acute Rehab PT Goals PT Goal Formulation: Patient unable to participate in goal setting Time For Goal Achievement: 03/26/14 Potential to Achieve Goals: Fair    Frequency Min 3X/week   Barriers to discharge         Co-evaluation               End of Session   Activity Tolerance: Patient limited by pain Patient left: with call bell/phone within reach;in bed;with bed alarm set Nurse Communication: Mobility status;Weight bearing status         Time: 1125-1141 PT Time Calculation (min): 16 min   Charges:   PT Evaluation $Initial PT Evaluation Tier I: 1 Procedure PT Treatments $Therapeutic Activity: 8-22 mins   PT G Codes:          LEMYRE,KATHrine E 03/19/2014, 1:09 PM Zenovia JarredKati Lemyre, PT, DPT 03/19/2014 Pager: [phone]

## 2014-03-19 NOTE — Progress Notes (Signed)
INITIAL NUTRITION ASSESSMENT  DOCUMENTATION CODES Per approved criteria  -Not Applicable   INTERVENTION: - Ensure Complete BID - Nursing to continue to encourage increased meal intake - RD to continue to monitor   NUTRITION DIAGNOSIS: Inadequate oral intake related to dementia as evidenced by <25% meal intake.   Goal: Pt to consume >90% of meals/supplements  Monitor:  Weights, labs, intake   Reason for Assessment: Consult for assessment, low braden  80 y.o. female  Admitting Dx: Hip fracture  ASSESSMENT: Pt with past medical history of dementia, dyslipidemia, hypothyroidism [facility] status post fall. Pt is not a good historian due to history of dementia. Family not at the bedside at this time.   - Pt alone in room, observed pt with minimal breakfast intake - Had right four part intertrochanteric fracture yesterday    Height: Ht Readings from Last 1 Encounters:  03/17/14 4\' 11"  (1.499 m)    Weight: Wt Readings from Last 1 Encounters:  09/08/13 160 lb (72.576 kg)    Ideal Body Weight: 98 lbs   % Ideal Body Weight: No current weight   Wt Readings from Last 10 Encounters:  09/08/13 160 lb (72.576 kg)  07/28/13 166 lb (75.297 kg)  03/09/12 168 lb (76.204 kg)  01/15/11 188 lb (85.276 kg)  12/11/09 197 lb (89.359 kg)    Usual Body Weight: Unable to assess    BMI:  There is no weight on file to calculate BMI.  Estimated Nutritional Needs Based on Height: Kcal: 1350-1550 Protein: 55-70g Fluid: 1.3-1.5L/day   Skin: Right hip incision   Diet Order: General  EDUCATION NEEDS: -No education needs identified at this time   Intake/Output Summary (Last 24 hours) at 03/19/14 1257 Last data filed at 03/19/14 1026  Gross per 24 hour  Intake   3500 ml  Output   1025 ml  Net   2475 ml    Last BM: 6/27  Labs:   Recent Labs Lab 03/17/14 1756 03/18/14 0627 03/19/14 0655  NA 138 137 136*  K 4.6 4.3 4.4  CL 102 100 99  CO2 24  26 27   BUN 11 13 15   CREATININE 0.55 0.59 0.61  CALCIUM 8.7 8.4 8.3*  GLUCOSE 260* 247* 199*    CBG (last 3)   Recent Labs  03/18/14 1627  GLUCAP 199*    Scheduled Meds: . acetaminophen  1,000 mg Oral Q8H  . calcium-vitamin D  1 tablet Oral Daily  . cefTRIAXone (ROCEPHIN)  IV  1 g Intravenous Daily  . docusate sodium  100 mg Oral BID  . enoxaparin (LOVENOX) injection  40 mg Subcutaneous Q24H  . levothyroxine  100 mcg Oral QAC breakfast  . omega-3 acid ethyl esters  1 g Oral Daily    Continuous Infusions: . 0.45 % NaCl with KCl 20 mEq / L 75 mL/hr at 03/19/14 1220    Past Medical History  Diagnosis Date  . Aortic stenosis     Mild to moderate, echo, femoral, 2008 /  mild, echo, March, 2011  . Mitral regurgitation     Mild, echo, November, 2008 /  mild, echo, March, 2011  . Diastolic dysfunction     Echo, 2008  . LVH (left ventricular hypertrophy)     Mild to moderate, echo, March, 2011  . Ejection fraction     65%, echo, 2008 /  70%, echo, March, 2011  . Dyslipidemia   . Hypertension   . Mitral stenosis  Severe left atrial dilatation, moderate mitral annular calcification, possible slight mitral inflow obstruction, echo, March, 2011  . Alzheimer's disease 09/08/2013  . Dyslipidemia   . Diabetes mellitus without complication     Past Surgical History  Procedure Laterality Date  . Abdominal hysterectomy      Charlott RakesHeather Winkler MS, RD, LDN [phone] Pager [phone] Weekend/After Hours Pager

## 2014-03-19 NOTE — Progress Notes (Signed)
[facility] Progress Note  Subjective  Lying in bed Nurse attempting to feed oatmeal Received some oxy IR a few minutes ago due to severe pain    Objective   BP 136/83  Pulse 94  Temp(Src) 99 F (37.2 C) (Axillary)  Resp 20  Ht 4\' 11"  (1.499 m)  SpO2 100%  Intake/Output     06/28 0701 - 06/29 0700 06/29 0701 - 06/30 0700   P.O. 60    I.V. 3280    IV Piggyback 150    Total Intake 3490     Urine 850 100   Blood 200    Total Output 1050 100   Net +2440 -100        Stool Occurrence       Labs  Results for [name], [name] (MRN [id number]) as of 03/19/2014 10:32  Ref. Range 03/19/2014 06:55  Sodium Latest Range: 137-147 mEq/L 136 (L)  Potassium Latest Range: 3.7-5.3 mEq/L 4.4  Chloride Latest Range: 96-112 mEq/L 99  CO2 Latest Range: 19-32 mEq/L 27  BUN Latest Range: 6-23 mg/dL 15  Creatinine Latest Range: 0.50-1.10 mg/dL 5.280.61  Calcium Latest Range: 8.4-10.5 mg/dL 8.3 (L)  GFR calc non Af Amer Latest Range: >90 mL/min 82 (L)  GFR calc Af Amer Latest Range: >90 mL/min >90  Glucose Latest Range: 70-99 mg/dL 413199 (H)  WBC Latest Range: 4.0-10.5 K/uL 9.4  RBC Latest Range: 3.87-5.11 MIL/uL 2.79 (L)  Hemoglobin Latest Range: 12.0-15.0 Cohen/dL 9.5 (L)  HCT Latest Range: 36.0-46.0 % 28.1 (L)  MCV Latest Range: 78.0-100.0 fL 100.7 (H)  MCH Latest Range: 26.0-34.0 pg 34.1 (H)  MCHC Latest Range: 30.0-36.0 Cohen/dL 24.433.8  RDW Latest Range: 11.5-15.5 % 14.2  Platelets Latest Range: 150-400 K/uL 248    Exam  Gen: lying in bed, confused, stable  Ext:       Right Lower Extremity   Dressings c/d/i  Ext warm  + DP pulse  Swelling stable  Distal motor and sensory functions intact     Assessment and Plan   POD/HD#:   80 y/o female s/p fall with R IT femur fx  1. Fall  2. R intertrochanteric femur fx s/p IMN  WBAT   No ROM restrictions  PT/OT evals  Ice prn  Dressing changes prn starting tomorrow   3. UTI  On rocephin  Awaiting Cx  4.  Dementia  Stable  5. DVT/PE prophylaxis  Lovenox  Would dc with 3 weeks of lovenox  6. Pain  Minimize narcotics due to base line dementia  Will schedule tylenol   7. Medical issues  Per primary service    8. Metabolic bone disease/osteoprosis  Will check vitamin D labs  9. dispo  Therapy evals pending  Suspect pt will need snf at dc      Mearl [name], PA-C [facility] [phone] (P) 03/19/2014 10:30 AM  **Disclaimer: This note may have been dictated with voice recognition software. Similar sounding words can inadvertently be transcribed and this note may contain transcription errors which may not have been corrected upon publication of note.**

## 2014-03-19 NOTE — Progress Notes (Signed)
Clinical Social Work Department CLINICAL SOCIAL WORK PLACEMENT NOTE 03/19/2014  Patient:  [name],Kayla G  Account Number:  [id number] Admit date:  03/17/2014  Clinical Social Worker:  Cori RazorJAMIE HAIDINGER, LCSW  Date/time:  03/19/2014 11:08 AM  Clinical Social Work is seeking post-discharge placement for this patient at the following level of care:   SKILLED NURSING   (*CSW will update this form in Epic as items are completed)     Patient/family provided with [facility] Department of Clinical Social Work's list of facilities offering this level of care within the geographic area requested by the patient (or if unable, by the patient's family).  03/19/2014  Patient/family informed of their freedom to choose among providers that offer the needed level of care, that participate in Medicare, Medicaid or managed care program needed by the patient, have an available bed and are willing to accept the patient.  03/19/2014  Patient/family informed of [facility]' ownership interest in [facility], as well as of the fact that they are under no obligation to receive care at this facility.  PASARR submitted to EDS on 03/19/2014 PASARR number received on 03/19/2014  FL2 transmitted to all facilities in geographic area requested by pt/family on  03/19/2014 FL2 transmitted to all facilities within larger geographic area on   Patient informed that his/her managed care company has contracts with or will negotiate with  certain facilities, including the following:     Patient/family informed of bed offers received:   Patient chooses bed at  Physician recommends and patient chooses bed at    Patient to be transferred to  on   Patient to be transferred to facility by  Patient and family notified of transfer on  Name of family member notified:    The following physician request were entered in Epic:   Additional Comments:  Cori RazorJamie Haidinger LCSW [phone]

## 2014-03-19 NOTE — Progress Notes (Signed)
OT Cancellation Note  Patient Details Name: [name] MRN: [id number] DOB: 10-01-1928   Cancelled Treatment:    Reason Eval/Treat Not Completed: Pain limiting ability to participate Noted pt in significant pain with mobility with PT.  Will check on pt next day to perform OT eval as appropriate.  Alba CoryREDDING, Lorraine D 03/19/2014, 1:31 PM

## 2014-03-19 NOTE — Progress Notes (Addendum)
Clinical Social Work Department BRIEF PSYCHOSOCIAL ASSESSMENT 03/19/2014  Patient:  Kayla FantasiaGEORGE,Jolie G     Account Number:  [id number]     Admit date:  03/17/2014  Clinical Social Worker:  Candie ChromanHAIDINGER,JAMIE, LCSW  Date/Time:  03/19/2014 10:51 AM  Referred by:  Physician  Date Referred:  03/17/2014 Referred for  SNF Placement   Other Referral:   Interview type:  Family Other interview type:    PSYCHOSOCIAL DATA Living Status:  FAMILY Admitted from facility:   Level of care:   Primary support name:  Lenward ChancellorBlake Gann Primary support relationship to patient:  FAMILY Degree of support available:   supportive    CURRENT CONCERNS Current Concerns  Post-Acute Placement   Other Concerns:    SOCIAL WORK ASSESSMENT / PLAN Pt is an 80 yr old female living at home prior to hospitalization. Pt fell at home resulting in a right femur fx. Pt had surgery on 6/27. CSW spoke with pt's POA Lenward Chancellor( Blake Gann ) to assist with d/c planning. ST Rehab will be needed following hospital d/c. CSW has initiated SNF  search and bed offers are pending. Pt has [employer] medicare . CSW will assist with authorization process.   Assessment/plan status:  Psychosocial Support/Ongoing Assessment of Needs Other assessment/ plan:   Information/referral to community resources:   Insurance coverage for SNF and ambulance transport reviewed.    PATIENT'S/FAMILY'S RESPONSE TO PLAN OF CARE: Pt's mood was bright this am. She is confused and unable to participate in d/c planning. [facility]. A decision from SNF is pending. POA appreciates assistance from CSW.   Cori RazorJamie Haidinger LCSW [phone]

## 2014-03-19 NOTE — Progress Notes (Signed)
CARE MANAGEMENT NOTE 03/19/2014  Patient:  [name],[name]   Account Number:  [id number]  Date Initiated:  03/19/2014  Documentation initiated by:  DAVIS,RHONDA  Subjective/Objective Assessment:   pt with hx of fall, rt femur fracture confirmed and patient taken to or late in day on [id number]     Action/Plan:   snf versus home with hhc   Anticipated DC Date:  03/22/2014   Anticipated DC Plan:  HOME W HOME HEALTH SERVICES  In-house referral  Clinical Social Worker      DC Planning Services  NA      [facility]  NA   Choice offered to / List presented to:  NA   DME arranged  NA      DME agency  NA     HH arranged  NA      HH agency  NA   Status of service:  In process, will continue to follow Medicare Important Message given?  NA - LOS <3 / Initial given by admissions (If response is "NO", the following Medicare IM given date fields will be blank) Date Medicare IM given:   Date Additional Medicare IM given:    Discharge Disposition:    Per UR Regulation:  Reviewed for med. necessity/level of care/duration of stay  If discussed at Long Length of Stay Meetings, dates discussed:    Comments:  [id number]/Rhonda Davis,RN,BSN,CCM:

## 2014-03-19 NOTE — Progress Notes (Signed)
Patient ID: Kayla FantasiaMargaret G Cohen, female   DOB: 1929/02/28, 80 y.o.   MRN: [id number] [facility] PROGRESS NOTE  Kayla FantasiaMargaret G Mcculley [id number] DOB: 1929/02/28 DOA: 03/17/2014 PCP: Josue HectorNYLAND,LEONARD ROBERT, MD  Brief narrative: 80 year old female with past medical history of dementia, dyslipidemia, hypothyroidism who [facility] status post fall. Pt sustained right hip showed displaced intertrochanteric and subtrochanteric fracture of proximal right femur. CT head was unremarkable. Pt underwent intramedullary nailing 03/18/2014 with no subsequent complications.   Assessment & Plan    Principal Problem:  Right femoral fracture  - status post intramedullary nailing; no subsequent complications. POD #1 - diet as tolerated   - analgesia as needed Active Problems:  Postoperative blood loss anemia - hemoglobin dropped to 9.5 (from 12.3 on admission) - continue to monitor CBC; no current indications for transfusion  UTI / Leukocytosis  - UA showed many bacteria, positive leukocytes and nitrites  - started rocephin 1 gm daily IV 03/18/2014  - follow up urine culture results - pending  Dementia  - stable  Dyslipidemia  - continue omega 3 supplementation  History of diastolic CHF, chronic  - her last 2 D ECHO in 2011 showed essentially normal EF. On this admission, 2 D ECHO showed EF of 55% - the 12 lead EKG on admission showed sinus tachycardia  Hypothyroidism  - TSH WNL on this admission  - continue levothyroxine    DVT prophylaxis: none prior to surgery    Radiological Exams on Admission:  Dg Chest 1 View  03/17/2014 IMPRESSION: No active disease. Low lung volumes. Electronically Signed By: Natasha MeadLiviu Pop M.D. On: 03/17/2014 13:29  Dg Hip Complete Right  03/17/2014 IMPRESSION: Displaced intertrochanteric and subtrochanteric fracture of proximal right femur. Diffuse osteopenia. Electronically Signed By: Natasha MeadLiviu Pop M.D. On: 03/17/2014 13:28  Dg Femur Right  03/17/2014  IMPRESSION: Comminuted intertrochanteric fracture proximal right femur. Electronically Signed By: Annia Beltrew Davis M.D. On: 03/17/2014 13:27  Ct Head Wo Contrast  03/17/2014 IMPRESSION: No acute intracranial abnormality. Stable atrophy and extensive chronic white matter disease.    Code Status: Full  Family Communication: family not at the bedside  Disposition Plan: to SNF once stable   Consultants:  Orthopedic surgery  Procedures:  None  Antibiotics:  Rocephin 03/18/2014 -->   [name], [name], MD  [facility] Pager [phone]  If 7PM-7AM, please contact night-coverage [email or web address] Password [email or web address] 03/19/2014, 1:32 PM   LOS: 2 days    HPI/Subjective: No acute overnight events.  Objective: Filed Vitals:   03/18/14 2345 03/19/14 0200 03/19/14 0530 03/19/14 1021  BP:  125/78 130/76 136/83  Pulse: 115 94 82 94  Temp:  98.3 F (36.8 C) 99.4 F (37.4 C) 99 F (37.2 C)  TempSrc:  Oral Oral Axillary  Resp:  20 20   Height:      SpO2:  98% 100%     Intake/Output Summary (Last 24 hours) at 03/19/14 1332 Last data filed at 03/19/14 1026  Gross per 24 hour  Intake   3500 ml  Output   1025 ml  Net   2475 ml    Exam:   General:  Pt is not in distress, awake  Cardiovascular: Regular rate and rhythm, S1/S2 (+)  Respiratory: Clear to auscultation bilaterally, no wheezing  Abdomen: Soft, non tender, non distended, bowel sounds present  Extremities: No edema, pulses DP and PT palpable bilaterally  Neuro: Grossly nonfocal  Data Reviewed: Basic Metabolic Panel:  Recent Labs Lab 03/17/14 1330 03/17/14 1756 03/18/14 0627 03/19/14  0655  NA 138 138 137 136*  K 3.7 4.6 4.3 4.4  CL 99 102 100 99  CO2 23 24 26 27   GLUCOSE 213* 260* 247* 199*  BUN 9 11 13 15   CREATININE 0.56 0.55 0.59 0.61  CALCIUM 9.0 8.7 8.4 8.3*   Liver Function Tests:  Recent Labs Lab 03/17/14 1756  AST 16  ALT 11  ALKPHOS 59  BILITOT 0.7  PROT 6.4  ALBUMIN 2.9*   No results found for  this basename: LIPASE, AMYLASE,  in the last 168 hours No results found for this basename: AMMONIA,  in the last 168 hours CBC:  Recent Labs Lab 03/17/14 1330 03/17/14 1756 03/18/14 0627 03/19/14 0655  WBC 11.0* 12.2* 12.9* 9.4  NEUTROABS 8.8*  --   --   --   HGB 12.2 12.3 11.1* 9.5*  HCT 35.9* 35.2* 32.5* 28.1*  MCV 99.4 100.9* 100.9* 100.7*  PLT 244 233 254 248   Cardiac Enzymes: No results found for this basename: CKTOTAL, CKMB, CKMBINDEX, TROPONINI,  in the last 168 hours BNP: No components found with this basename: POCBNP,  CBG:  Recent Labs Lab 03/18/14 1627  GLUCAP 199*    Recent Results (from the past 240 hour(s))  SURGICAL PCR SCREEN     Status: None   Collection Time    03/17/14  8:25 PM      Result Value Ref Range Status   MRSA, PCR NEGATIVE  NEGATIVE Final   Staphylococcus aureus NEGATIVE  NEGATIVE Final   Comment:            The Xpert SA Assay (FDA     approved for NASAL specimens     in patients over 78 years of age),     is one component of     a comprehensive surveillance     program.  Test performance has     been validated by [facility] for patients greater     than or equal to 383 year old.     It is not intended     to diagnose infection nor to     guide or monitor treatment.      Scheduled Meds: . acetaminophen  1,000 mg Oral Q8H  . calcium-vitamin D  1 tablet Oral Daily  . cefTRIAXone (ROCEPHIN)  IV  1 g Intravenous Daily  . docusate sodium  100 mg Oral BID  . enoxaparin (LOVENOX) injection  40 mg Subcutaneous Q24H  . feeding supplement (ENSURE COMPLETE)  237 mL Oral BID BM  . levothyroxine  100 mcg Oral QAC breakfast  . omega-3 acid ethyl esters  1 g Oral Daily   Continuous Infusions: . 0.45 % NaCl with KCl 20 mEq / L 75 mL/hr at 03/19/14 1220

## 2014-03-20 DIAGNOSIS — I359 Nonrheumatic aortic valve disorder, unspecified: Secondary | ICD-10-CM

## 2014-03-20 LAB — URINE CULTURE: Colony Count: >=100000

## 2014-03-20 LAB — CBC
HCT: 25.2 % — ABNORMAL LOW (ref 36.0–46.0)
Hemoglobin: 8.6 g/dL — ABNORMAL LOW (ref 12.0–15.0)
MCH: 34.1 pg — ABNORMAL HIGH (ref 26.0–34.0)
MCHC: 34.1 g/dL (ref 30.0–36.0)
MCV: 100 fL (ref 78.0–100.0)
PLATELETS: 237 10*3/uL (ref 150–400)
RBC: 2.52 MIL/uL — ABNORMAL LOW (ref 3.87–5.11)
RDW: 14.2 % (ref 11.5–15.5)
WBC: 8 10*3/uL (ref 4.0–10.5)

## 2014-03-20 LAB — VITAMIN D 25 HYDROXY (VIT D DEFICIENCY, FRACTURES): VIT D 25 HYDROXY: 23 ng/mL — AB (ref 30–89)

## 2014-03-20 LAB — BASIC METABOLIC PANEL
BUN: 9 mg/dL (ref 6–23)
CHLORIDE: 101 meq/L (ref 96–112)
CO2: 25 mEq/L (ref 19–32)
Calcium: 8.1 mg/dL — ABNORMAL LOW (ref 8.4–10.5)
Creatinine, Ser: 0.54 mg/dL (ref 0.50–1.10)
GFR calc non Af Amer: 86 mL/min — ABNORMAL LOW (ref >90)
Glucose, Bld: 209 mg/dL — ABNORMAL HIGH (ref 70–99)
Potassium: 4.3 mEq/L (ref 3.7–5.3)
Sodium: 135 mEq/L — ABNORMAL LOW (ref 137–147)

## 2014-03-20 MED ORDER — OXYCODONE HCL 5 MG PO TABS: 5.0000 mg | ORAL_TABLET | ORAL | Status: AC | PRN

## 2014-03-20 MED ORDER — ENSURE COMPLETE PO LIQD: 237.0000 mL | Freq: Two times a day (BID) | ORAL | Status: DC

## 2014-03-20 MED ORDER — DSS 100 MG PO CAPS: 100.0000 mg | ORAL_CAPSULE | Freq: Two times a day (BID) | ORAL | Status: AC | PRN

## 2014-03-20 MED ORDER — NITROFURANTOIN MONOHYD MACRO 100 MG PO CAPS: 100.0000 mg | ORAL_CAPSULE | Freq: Two times a day (BID) | ORAL | Status: DC

## 2014-03-20 MED ORDER — ACETAMINOPHEN 500 MG PO TABS: 1000.0000 mg | ORAL_TABLET | Freq: Three times a day (TID) | ORAL | Status: AC

## 2014-03-20 MED ORDER — LEVOFLOXACIN 500 MG PO TABS: 500.0000 mg | ORAL_TABLET | Freq: Every day | ORAL | Status: DC

## 2014-03-20 MED ORDER — ENOXAPARIN SODIUM 40 MG/0.4ML ~~LOC~~ SOLN: 40.0000 mg | SUBCUTANEOUS | Status: DC

## 2014-03-20 MED ORDER — ONDANSETRON HCL 4 MG PO TABS: 4.0000 mg | ORAL_TABLET | Freq: Four times a day (QID) | ORAL | Status: DC | PRN

## 2014-03-20 MED ORDER — ALPRAZOLAM 0.5 MG PO TABS: 0.5000 mg | ORAL_TABLET | Freq: Every evening | ORAL | Status: AC | PRN

## 2014-03-20 NOTE — Discharge Summary (Addendum)
Physician Discharge Summary  [name] ZOX:[id number] DOB: January 17, 1929 DOA: 03/17/2014  PCP: Josue HectorNYLAND,LEONARD ROBERT, MD  Admit date: 03/17/2014 Discharge date: 03/20/2014  Recommendations for Outpatient Follow-up:  1. Check CBC and BMP per SNF protocol 2. Continue Lovenox as prescribed for 3 weeks for DVT prophylaxis.  Discharge Diagnoses:  Principal Problem:   Hip fracture Active Problems:   UTI (urinary tract infection)   HYPERTENSION, UNSPECIFIED   Diastolic dysfunction   Ejection fraction   Dyslipidemia   Alzheimer's disease   Discharge Condition: stable   Diet recommendation: as tolerated   History of present illness:  80 year old female with past medical history of dementia, dyslipidemia, hypothyroidism who [facility] status post fall. Pt sustained right hip showed displaced intertrochanteric and subtrochanteric fracture of proximal right femur. CT head was unremarkable. Pt underwent intramedullary nailing 03/18/2014 with no subsequent complications.   Assessment & Plan   Principal Problem:  Right femoral fracture  - status post intramedullary nailing; no subsequent complications. POD #2 - diet as tolerated  - analgesia as needed  Active Problems:  Postoperative blood loss anemia  - hemoglobin dropped to 8.6 (from 12.3 on admission)  - continue to monitor CBC at least in next 2 days; no current indications for transfusion  UTI / Leukocytosis  - UA showed many bacteria, positive leukocytes and nitrites - Urine culture is growing enterobacter species. Needs to be on Levaquin per sensitivity report, 500 mg PO daily. - started rocephin 1 gm daily IV 03/18/2014 in hospital  Dementia  - stable  Dyslipidemia  - continue omega 3 supplementation  History of diastolic CHF, chronic  - her last 2 D ECHO in 2011 showed essentially normal EF. On this admission, 2 D ECHO showed EF of 55%  - the 12 lead EKG on admission showed sinus tachycardia   Hypothyroidism  - TSH WNL on this admission  - continue levothyroxine    Radiological Exams on Admission:  Dg Chest 1 View  03/17/2014 IMPRESSION: No active disease. Low lung volumes. Electronically Signed By: Natasha MeadLiviu Pop M.D. On: 03/17/2014 13:29   Dg Hip Complete Right  03/17/2014 IMPRESSION: Displaced intertrochanteric and subtrochanteric fracture of proximal right femur. Diffuse osteopenia. Electronically Signed By: Natasha MeadLiviu Pop M.D. On: 03/17/2014 13:28   Dg Femur Right  03/17/2014 IMPRESSION: Comminuted intertrochanteric fracture proximal right femur. Electronically Signed By: Annia Beltrew Davis M.D. On: 03/17/2014 13:27   Ct Head Wo Contrast  03/17/2014 IMPRESSION: No acute intracranial abnormality. Stable atrophy and extensive chronic white matter disease.    Code Status: Full  Family Communication: family not at the bedside   Consultants:  Orthopedic surgery  Procedures:  None  Antibiotics:  Rocephin 03/18/2014 --> 03/20/2014   Signed:  [name], [name], MD  [facility] 03/20/2014, 10:28 AM  Pager #: [phone]   Discharge Exam: Filed Vitals:   03/20/14 0630  BP: 125/82  Pulse: 115  Temp: 98.9 F (37.2 C)  Resp: 18   Filed Vitals:   03/19/14 1355 03/19/14 1756 03/19/14 2129 03/20/14 0630  BP: 115/71 119/82 136/75 125/82  Pulse: 92 92 91 115  Temp: 98.5 F (36.9 C) 98.7 F (37.1 C) 97.3 F (36.3 C) 98.9 F (37.2 C)  TempSrc: Oral Oral Oral Oral  Resp: 18 18 18 18   Height:      SpO2: 100% 93% 94% 96%    Exam:  General: Pt is alert, awake  Cardiovascular: Regular rate and rhythm, S1/S2 (+)  Respiratory: Clear to auscultation bilaterally, no wheezing  Abdomen: Soft, non tender, non distended, bowel sounds present  Extremities: Pulses DP and PT palpable bilaterally  Neuro: Grossly nonfoca  Discharge Instructions  Discharge Instructions   Call MD for:  difficulty breathing, headache or visual disturbances    Complete by:  As directed      Call MD for:   persistant dizziness or light-headedness    Complete by:  As directed      Call MD for:  persistant nausea and vomiting    Complete by:  As directed      Call MD for:  severe uncontrolled pain    Complete by:  As directed      Diet - low sodium heart healthy    Complete by:  As directed      Discharge instructions    Complete by:  As directed   1. Continue Lovenox as prescribed for 3 weeks for DVT prophylaxis.     Increase activity slowly    Complete by:  As directed             Medication List         acetaminophen 500 MG tablet  Commonly known as:  TYLENOL  Take 2 tablets (1,000 mg total) by mouth every 8 (eight) hours.     ALPRAZolam 0.5 MG tablet  Commonly known as:  XANAX  Take 1 tablet (0.5 mg total) by mouth at bedtime as needed for anxiety.     calcium-vitamin D 500-200 MG-UNIT per tablet  Commonly known as:  OSCAL WITH D  Take 1 tablet by mouth daily.     DSS 100 MG Caps  Take 100 mg by mouth 2 (two) times daily as needed for mild constipation.     enoxaparin 40 MG/0.4ML injection  Commonly known as:  LOVENOX  Inject 0.4 mLs (40 mg total) into the skin daily.     feeding supplement (ENSURE COMPLETE) Liqd  Take 237 mLs by mouth 2 (two) times daily between meals.     Fish Oil 1200 MG Caps  Take 1 capsule by mouth daily.     levofloxacin 500 MG tablet  Commonly known as:  LEVAQUIN  Take 1 tablet (500 mg total) by mouth daily.     levothyroxine 100 MCG tablet  Commonly known as:  SYNTHROID, LEVOTHROID  Take 100 mcg by mouth daily before breakfast.     ondansetron 4 MG tablet  Commonly known as:  ZOFRAN  Take 1 tablet (4 mg total) by mouth every 6 (six) hours as needed for nausea.     oxyCODONE 5 MG immediate release tablet  Commonly known as:  Oxy IR/ROXICODONE  Take 1 tablet (5 mg total) by mouth every 4 (four) hours as needed for severe pain or breakthrough pain.            Follow-up Information   Follow up with Josue HectorNYLAND,LEONARD ROBERT, MD.  Schedule an appointment as soon as possible for a visit in 2 weeks. (Follow up appt after recent hospitalization)    Specialty:  Family Medicine   Contact information:   [address] [place] [id number] [phone]        The results of significant diagnostics from this hospitalization (including imaging, microbiology, ancillary and laboratory) are listed below for reference.    Significant Diagnostic Studies: Dg Chest 1 View  03/17/2014   CLINICAL DATA:  Pain post fall  EXAM: CHEST - 1 VIEW  COMPARISON:  None.  FINDINGS: Cardiomediastinal silhouette is unremarkable. No acute infiltrate or  pleural effusion. Low lung volumes. Bony thorax is unremarkable.  IMPRESSION: No active disease.  Low lung volumes.   Electronically Signed   By: Natasha Mead M.D.   On: 03/17/2014 13:29   Dg Hip Complete Right  03/17/2014   CLINICAL DATA:  Pain post fall  EXAM: RIGHT HIP - COMPLETE 2+ VIEW  COMPARISON:  None.  FINDINGS: Two views of the right hip submitted. Diffuse osteopenia. There is a displaced intertrochanteric and subtrochanteric fracture of the proximal right femur. There is medial displacement of lesser trochanter fragment.  IMPRESSION: Displaced intertrochanteric and subtrochanteric fracture of proximal right femur. Diffuse osteopenia.   Electronically Signed   By: Natasha Mead M.D.   On: 03/17/2014 13:28   Dg Femur Right  03/18/2014   CLINICAL DATA:  ORIF of right proximal femur.  EXAM: RIGHT FEMUR - 2 VIEW  COMPARISON:  One day prior  FINDINGS: 4 intraoperative images. These demonstrate placement of a dynamic hip screw across the previously described comminuted intratrochanteric great femur fracture. No acute hardware complication. Osteoarthritis about the right knee. Improved alignment with residual superior displacement of the distal fracture fragment.  IMPRESSION: Expected appearance after proximal right femoral fixation.   Electronically Signed   By: Jeronimo Greaves M.D.   On: 03/18/2014 15:17    Dg Femur Right  03/17/2014   CLINICAL DATA:  Patient complains of right hip and femur pain status post fall.  EXAM: RIGHT FEMUR - 2 VIEW  COMPARISON:  03/17/2014.  FINDINGS: There is a comminuted oblique fracture through the intertrochanteric region of the proximal right femur. No definite evidence for associated acute fractures.  IMPRESSION: Comminuted intertrochanteric fracture proximal right femur.   Electronically Signed   By: Annia Belt M.D.   On: 03/17/2014 13:27   Ct Head Wo Contrast  03/17/2014   CLINICAL DATA:  Leg pain post fall  EXAM: CT HEAD WITHOUT CONTRAST  TECHNIQUE: Contiguous axial images were obtained from the base of the skull through the vertex without intravenous contrast.  COMPARISON:  09/11/2013  FINDINGS: No skull fracture is noted. The mastoid air cells are unremarkable. Stable atrophy and chronic white matter disease. There is mucosal thickening with partial opacification right maxillary sinus. Small mucous retention cyst left maxillary sinus.  No acute cortical infarction. No mass lesion is noted on this unenhanced scan. Atherosclerotic calcifications of carotid siphon.  IMPRESSION: No acute intracranial abnormality. Stable atrophy and extensive chronic white matter disease.   Electronically Signed   By: Natasha Mead M.D.   On: 03/17/2014 13:30   Dg Pelvis Portable  03/18/2014   CLINICAL DATA:  Postop for right hip intra medullary rod placement.  EXAM: PORTABLE RIGHT FEMUR - 2 VIEW; PORTABLE PELVIS 1-2 VIEWS  COMPARISON:  Intraoperative studies of earlier in the day.  FINDINGS: Five views of the right femur demonstrate dynamic hip screw placement across the previously described intra trochanteric femur fracture. Persisting comminution. No acute hardware complication. Underlying osteoarthritis involves the medial compartment of the knee. No new fracture identified. Vascular calcifications. The cross-table lateral view of the proximal femur is technique degraded.  AP view of the  pelvis also demonstrates appropriate appearance of the dynamic screw in the right femur. Mild osteoarthritis of the left hip.  IMPRESSION: Expected appearance after fixation of the proximal right femur.   Electronically Signed   By: Jeronimo Greaves M.D.   On: 03/18/2014 15:58   Dg Femur Right Port  03/18/2014   CLINICAL DATA:  Postop for right hip intra  medullary rod placement.  EXAM: PORTABLE RIGHT FEMUR - 2 VIEW; PORTABLE PELVIS 1-2 VIEWS  COMPARISON:  Intraoperative studies of earlier in the day.  FINDINGS: Five views of the right femur demonstrate dynamic hip screw placement across the previously described intra trochanteric femur fracture. Persisting comminution. No acute hardware complication. Underlying osteoarthritis involves the medial compartment of the knee. No new fracture identified. Vascular calcifications. The cross-table lateral view of the proximal femur is technique degraded.  AP view of the pelvis also demonstrates appropriate appearance of the dynamic screw in the right femur. Mild osteoarthritis of the left hip.  IMPRESSION: Expected appearance after fixation of the proximal right femur.   Electronically Signed   By: Jeronimo Greaves M.D.   On: 03/18/2014 15:58   Dg C-arm 61-120 Min-no Report  03/18/2014   CLINICAL DATA: right hip fracture   C-ARM 61-120 MINUTES  Fluoroscopy was utilized by the requesting physician.  No radiographic  interpretation.     Microbiology: Recent Results (from the past 240 hour(s))  SURGICAL PCR SCREEN     Status: None   Collection Time    03/17/14  8:25 PM      Result Value Ref Range Status   MRSA, PCR NEGATIVE  NEGATIVE Final   Staphylococcus aureus NEGATIVE  NEGATIVE Final   Comment:            The Xpert SA Assay (FDA     approved for NASAL specimens     in patients over 26 years of age),     is one component of     a comprehensive surveillance     program.  Test performance has     been validated by [facility] for patients greater     than  or equal to 66 year old.     It is not intended     to diagnose infection nor to     guide or monitor treatment.  URINE CULTURE     Status: None   Collection Time    03/18/14 10:36 AM      Result Value Ref Range Status   Specimen Description URINE, CATHETERIZED   Final   Special Requests NONE   Final   Culture  Setup Time     Final   Value: 03/19/2014 00:06     Performed at [facility] Count     Final   Value: >=100,000 COLONIES/ML     Performed at [facility]   Culture     Final   Value: GRAM NEGATIVE RODS     Performed at [facility]   Report Status PENDING   Incomplete     Labs: Basic Metabolic Panel:  Recent Labs Lab 03/17/14 1330 03/17/14 1756 03/18/14 0627 03/19/14 0655 03/20/14 0500  NA 138 138 137 136* 135*  K 3.7 4.6 4.3 4.4 4.3  CL 99 102 100 99 101  CO2 23 24 26 27 25   GLUCOSE 213* 260* 247* 199* 209*  BUN 9 11 13 15 9   CREATININE 0.56 0.55 0.59 0.61 0.54  CALCIUM 9.0 8.7 8.4 8.3* 8.1*   Liver Function Tests:  Recent Labs Lab 03/17/14 1756  AST 16  ALT 11  ALKPHOS 59  BILITOT 0.7  PROT 6.4  ALBUMIN 2.9*   No results found for this basename: LIPASE, AMYLASE,  in the last 168 hours No results found for this basename: AMMONIA,  in the last 168 hours CBC:  Recent Labs Lab 03/17/14 1330 03/17/14 1756 03/18/14 0627 03/19/14 0655 03/20/14 0500  WBC 11.0* 12.2* 12.9* 9.4 8.0  NEUTROABS 8.8*  --   --   --   --   HGB 12.2 12.3 11.1* 9.5* 8.6*  HCT 35.9* 35.2* 32.5* 28.1* 25.2*  MCV 99.4 100.9* 100.9* 100.7* 100.0  PLT 244 233 254 248 237   Cardiac Enzymes: No results found for this basename: CKTOTAL, CKMB, CKMBINDEX, TROPONINI,  in the last 168 hours BNP: BNP (last 3 results) No results found for this basename: PROBNP,  in the last 8760 hours CBG:  Recent Labs Lab 03/18/14 1627  GLUCAP 199*    Time coordinating discharge: Over 30 minutes

## 2014-03-20 NOTE — Evaluation (Signed)
Occupational Therapy Evaluation Patient Details Name: Tylene FantasiaMargaret G Dotzler MRN: [id number] DOB: May 09, 1929 Today's Date: 03/20/2014    History of Present Illness Pt is an 80 year old female with hx of Alzheimers and dementia admitted 6/27 s/p fall at home resulting in R intertrochanteric femur fx and currently s/p R IM nail.   Clinical Impression   Worked on seated ADL to reach to bilateral foot for LB dressing. Pt tolerated R LE unsupported for short period of time to attempt doffing and donning sock. Pt unable to provide history and no family present. Unsure of how much of her ADL she was able to perform PTA. Will see pt on trial basis for OT to work toward increased independence with self care tasks.     Follow Up Recommendations  SNF;Supervision/Assistance - 24 hour    Equipment Recommendations  Other (comment) (defer to next venue)    Recommendations for Other Services       Precautions / Restrictions Precautions Precautions: Fall Restrictions Weight Bearing Restrictions: Yes RLE Weight Bearing: Partial weight bearing RLE Partial Weight Bearing Percentage or Pounds: 50 Other Position/Activity Restrictions: PWB per orders however PA-C note 6/29 states WBAT R LE with no ROM restrictions      Mobility Bed Mobility              Transfers                 Balance                                            ADL Overall ADL's : Needs assistance/impaired Eating/Feeding: Independent (drinking from cup)   Grooming: Wash/dry face;Supervision/safety;Sitting Grooming Details (indicate cue type and reason): min verbal cues for thoroughness. Pt initiated well when requested to wash face.  Upper Body Bathing: Minimal assitance;Sitting   Lower Body Bathing: Maximal assistance;Sitting/lateral leans; periareas not tested.  Lower Body Bathing Details (indicate cue type and reason): decreased functional reach to bilateral foot Upper Body Dressing :  Minimal assistance;Sitting   Lower Body Dressing: Total assistance;Sitting/lateral leans for socks only. Didn't test standing aspects. Lower Body Dressing Details (indicate cue type and reason): attempted to don L sock from unsupported sitting but pt unable to start sock over toes. Pt able to help pull up sock once it was started over her foot for her. Unable to reach to R foot. Tolerated R LE unsupported for a short period of time.   Toilet Transfer Details (indicate cue type and reason): not tested. seated ADL only.            General ADL Comments: Pt frequently pointing to tv and verbalizing but unable to determine what pt is trying to communicate. Pt followed some commands including to wash her face once handed the washcloth. She also raised her UEs up to command for ROM testing. She could not verbalize her birthdate or where she is. Unsure of how much of her ADL she was able to perform PTA as no family present and pt is not a reliable historian. She took a few sips of strawberry Ensure and did report "that is good."      Vision                 Additional Comments: unable to formally assess with pt. appears grossly [facility]   Perception     Praxis  Pertinent Vitals/Pain 2 on faces scale when R LE repositioned.      Hand Dominance     Extremity/Trunk Assessment Upper Extremity Assessment Upper Extremity Assessment: Overall WFL for tasks assessed (grossly WFL per observation and full ROM noted)           Communication Communication Communication: HOH   Cognition Arousal/Alertness: Awake/alert Behavior During Therapy: WFL for tasks assessed/performed Overall Cognitive Status: History of cognitive impairments - at baseline                     General Comments       Exercises       Shoulder Instructions      Home Living Family/patient expects to be discharged to:: Skilled nursing facility                                         Prior Functioning/Environment          Comments: likely somewhat mobile due to s/p fall, pt with dementia and poor historian, no family present    OT Diagnosis: Generalized weakness   OT Problem List: Decreased strength;Decreased knowledge of use of DME or AE;Decreased knowledge of precautions   OT Treatment/Interventions: Self-care/ADL training;Patient/family education;Therapeutic activities;DME and/or AE instruction    OT Goals(Current goals can be found in the care plan section) Acute Rehab OT Goals Patient Stated Goal: none stated. OT Goal Formulation: Patient unable to participate in goal setting Time For Goal Achievement: 03/27/14 Potential to Achieve Goals: Good  OT Frequency: Min 2X/week   Barriers to D/C:            Co-evaluation              End of Session    Activity Tolerance: Patient tolerated treatment well Patient left: in chair;with call bell/phone within reach;with chair alarm set   Time: 1100-1125 OT Time Calculation (min): 25 min Charges:  OT General Charges $OT Visit: 1 Procedure OT Evaluation $Initial OT Evaluation Tier I: 1 Procedure OT Treatments $Self Care/Home Management : 8-22 mins G-Codes:    Lennox LaityStone, Yanelle Sousa Stafford [phone] 03/20/2014, 1:20 PM

## 2014-03-20 NOTE — Discharge Instructions (Signed)
Hip Fracture °A hip fracture is a fracture of the upper part of your thigh bone (femur).  °CAUSES °A hip fracture is caused by a direct blow to the side of your hip. This is usually the result of a fall but can occur in other circumstances, such as an automobile accident. °RISK FACTORS °There is an increased risk of hip fractures in people with: °· An unsteady walking pattern (gait) and those with conditions that contribute to poor balance, such as Parkinson's disease or dementia. °· Osteopenia and osteoporosis. °· Cancer that spreads to the leg bones. °· Certain metabolic diseases. °SYMPTOMS  °Symptoms of hip fracture include: °· Pain over the injured hip. °· Inability to put weight on the leg in which the fracture occurred (although, some patients are able to walk after a hip fracture). °· Toes and foot of the affected leg point outward when you lie down. °DIAGNOSIS °A physical exam can determine if a hip fracture is likely to have occurred. X-ray exams are needed to confirm the fracture and to look for other injuries. The X-ray exam can help to determine the type of hip fracture. Rarely, the fracture is not visible on an X-ray image and a CT scan or MRI will have to be done. °TREATMENT  °The treatment for a fracture is usually surgery. This means using a screw, nail, or rod to hold the bones in place.  °HOME CARE INSTRUCTIONS °Take all medicines as directed by your health care provider. °SEEK MEDICAL CARE IF: °Pain continues, even after taking pain medicine. °MAKE SURE YOU: °· Understand these instructions.   °· Will watch your condition. °· Will get help right away if you are not doing well or get worse. °Document Released: 09/07/2005 Document Revised: 09/12/2013 Document Reviewed: 04/19/2013 °Exit[employer]® Patient Information ©2015 [employer]. This information is not intended to replace advice given to you by your health care provider. Make sure you discuss any questions you have with your health care  provider. ° °

## 2014-03-20 NOTE — Progress Notes (Signed)
Physical Therapy Treatment Patient Details Name: Tylene FantasiaMargaret G Boice MRN: [id number] DOB: [birth year] Today's Date: 03/20/2014    History of Present Illness Pt is an 80 year old female with hx of Alzheimers and dementia admitted 6/27 s/p fall at home resulting in R intertrochanteric femur fx and currently s/p R IM nail.    PT Comments    Assisted pt OOB to amb using EVA walker for increased support.  Pt more oriented.  Some expressive aphagia but following commands.  Pleasant.  Pt amb + 2 assist and 3rd following with recliner.  Positioned in recliner to comfort.  Follow Up Recommendations  SNF     Equipment Recommendations       Recommendations for Other Services       Precautions / Restrictions Precautions Precautions: Fall Restrictions Weight Bearing Restrictions: No RLE Weight Bearing: Weight bearing as tolerated RLE Partial Weight Bearing Percentage or Pounds: 50 Other Position/Activity Restrictions: WBAT per most recent ortho note    Mobility  Bed Mobility Overal bed mobility: +2 for physical assistance;Needs Assistance Bed Mobility: Supine to Sit     Supine to sit: +2 for physical assistance;Max assist     General bed mobility comments: repeat functional VC's and increased time pt was more able to self assist to EOB.  Transfers Overall transfer level: Needs assistance Equipment used: Rolling walker (2 wheeled) Transfers: Sit to/from Stand Sit to Stand: +2 physical assistance;Total assist         General transfer comment: elevated bed and + 2 assist used EVA walker  Ambulation/Gait Ambulation/Gait assistance: +2 physical assistance;Max assist Ambulation Distance (Feet): 12 Feet Assistive device: 2 person hand held assist Gait Pattern/deviations: Step-to pattern;Decreased stance time - right;Narrow base of support Gait velocity: decreased   General Gait Details: used EVA walker for increased supporrt and to increase amb distance   Information systems managertairs            Wheelchair Mobility    Modified Rankin (Stroke Patients Only)       Balance                                    Cognition Arousal/Alertness: Awake/alert Behavior During Therapy: WFL for tasks assessed/performed Overall Cognitive Status: History of cognitive impairments - at baseline                      Exercises      General Comments        Pertinent Vitals/Pain C/o R hip pain but unable to rate    Home Living Family/patient expects to be discharged to:: Skilled nursing facility                    Prior Function        Comments: likely somewhat mobile due to s/p fall, pt with dementia and poor historian, no family present   PT Goals (current goals can now be found in the care plan section) Progress towards PT goals: Progressing toward goals    Frequency  Min 3X/week    PT Plan      Co-evaluation             End of Session Equipment Utilized During Treatment: Gait belt Activity Tolerance: Patient limited by pain Patient left: in chair;with call bell/phone within reach;with chair alarm set     Time: [id number] PT Time Calculation (min): 35 min  Charges:  $Gait  Training: 8-22 mins $Therapeutic Activity: 8-22 mins                    G Codes:      Rica Koyanagi  PTA [facility]  Acute  Rehab Pager      [phone]

## 2014-03-20 NOTE — Progress Notes (Signed)
Clinical Social Work Department CLINICAL SOCIAL WORK PLACEMENT NOTE 03/20/2014  Patient:  [name],[name]  Account Number:  [id number] Admit date:  03/17/2014  Clinical Social Worker:  Cori RazorJAMIE HAIDINGER, LCSW  Date/time:  03/19/2014 11:08 AM  Clinical Social Work is seeking post-discharge placement for this patient at the following level of care:   SKILLED NURSING   (*CSW will update this form in Epic as items are completed)     [facility]/family provided with [facility] of Clinical [facility] Work's list of facilities offering this level of care within the geographic area requested by the patient (or if unable, by the patient's family).  03/19/2014  Patient/family informed of their freedom to choose among providers that offer the needed level of care, that participate in Medicare, Medicaid or managed care program needed by the patient, have an available bed and are willing to accept the patient.  03/19/2014  Patient/family informed of [facility]' ownership interest in [facility], as well as of the fact that they are under no obligation to receive care at this facility.  PASARR submitted to EDS on 03/19/2014 PASARR number received on 03/19/2014  FL2 transmitted to all facilities in geographic area requested by pt/family on  03/19/2014 FL2 transmitted to all facilities within larger geographic area on   Patient informed that his/her managed care company has contracts with or will negotiate with  certain facilities, including the following:     Patient/family informed of bed offers received:  03/19/2014 [facility] Physician recommends and patient chooses bed at    [facility] on  03/20/2014 Patient to be transferred to facility by [facility] Patient and family notified of transfer on 03/20/2014 Name of family member notified:  Campbell StallLarry Gann  The following physician request were entered  in Epic:   Additional Comments: Pt / family were in agreement with d/c to SNF today. [employer] provided authorization for SNF. NSG reviewed d/c summary, avs, scripts. Scripts were included in d/c packet.  Cori RazorJamie Haidinger LCSW [phone]

## 2014-03-21 ENCOUNTER — Encounter (HOSPITAL_COMMUNITY): Payer: Self-pay | Admitting: Orthopedic Surgery

## 2014-03-21 NOTE — Progress Notes (Signed)
Utilization review completed.  

## 2014-03-22 ENCOUNTER — Encounter (HOSPITAL_COMMUNITY): Payer: Self-pay | Admitting: Orthopedic Surgery

## 2014-03-23 LAB — VITAMIN D 1,25 DIHYDROXY
VITAMIN D 1, 25 (OH) TOTAL: 39 pg/mL (ref 18–72)
Vitamin D2 1, 25 (OH)2: <8 pg/mL
Vitamin D3 1, 25 (OH)2: 39 pg/mL

## 2014-04-28 ENCOUNTER — Emergency Department (HOSPITAL_COMMUNITY)
Admission: EM | Admit: 2014-04-28 | Discharge: 2014-04-29 | Disposition: A | Discharge status: Home / Self Care | Payer: Medicare HMO | Attending: Emergency Medicine | Admitting: Emergency Medicine

## 2014-04-28 ENCOUNTER — Encounter (HOSPITAL_COMMUNITY): Payer: Self-pay | Admitting: Emergency Medicine

## 2014-04-28 ENCOUNTER — Emergency Department (HOSPITAL_COMMUNITY): Payer: Medicare HMO

## 2014-04-28 DIAGNOSIS — I10 Essential (primary) hypertension: Secondary | ICD-10-CM | POA: Insufficient documentation

## 2014-04-28 DIAGNOSIS — E785 Hyperlipidemia, unspecified: Secondary | ICD-10-CM | POA: Diagnosis not present

## 2014-04-28 DIAGNOSIS — Z79899 Other long term (current) drug therapy: Secondary | ICD-10-CM | POA: Insufficient documentation

## 2014-04-28 DIAGNOSIS — IMO0002 Reserved for concepts with insufficient information to code with codable children: Secondary | ICD-10-CM | POA: Diagnosis present

## 2014-04-28 DIAGNOSIS — W050XXA Fall from non-moving wheelchair, initial encounter: Secondary | ICD-10-CM | POA: Diagnosis not present

## 2014-04-28 DIAGNOSIS — Y9289 Other specified places as the place of occurrence of the external cause: Secondary | ICD-10-CM | POA: Insufficient documentation

## 2014-04-28 DIAGNOSIS — E119 Type 2 diabetes mellitus without complications: Secondary | ICD-10-CM | POA: Diagnosis not present

## 2014-04-28 DIAGNOSIS — F0280 Dementia in other diseases classified elsewhere without behavioral disturbance: Secondary | ICD-10-CM | POA: Diagnosis not present

## 2014-04-28 DIAGNOSIS — Y9389 Activity, other specified: Secondary | ICD-10-CM | POA: Insufficient documentation

## 2014-04-28 DIAGNOSIS — W19XXXA Unspecified fall, initial encounter: Secondary | ICD-10-CM

## 2014-04-28 DIAGNOSIS — G309 Alzheimer's disease, unspecified: Secondary | ICD-10-CM | POA: Diagnosis not present

## 2014-04-28 NOTE — ED Notes (Signed)
Patient from home per EMS patient has un witnessed fall yesterday. Patient was found in a sitting position on the carpet. No LOC. Patient family states that patient has been calling out in pain all day. She was given OxyContin 5mg  about 3 hours ago. Patient had right hip replacement 8 days ago.

## 2014-04-28 NOTE — ED Notes (Signed)
On assessment patient nephew states patient slide out wheelchair and c/o hip pain and just wanted to get her check out.

## 2014-04-29 MED ORDER — HYDROCODONE-ACETAMINOPHEN 5-325 MG PO TABS: 1.0000 | ORAL_TABLET | Freq: Four times a day (QID) | ORAL | Status: AC | PRN

## 2014-04-29 NOTE — ED Provider Notes (Signed)
Medical screening examination/treatment/procedure(s) were performed by non-physician practitioner and as supervising physician I was immediately available for consultation/collaboration.   EKG Interpretation None        Loren Raceravid Kathleene Bergemann, MD 04/29/14 [phone]

## 2014-04-29 NOTE — ED Provider Notes (Signed)
CSN: [id number]     Arrival date & time 04/28/14  2221 History   First MD Initiated Contact with Patient 04/28/14 2239     Chief Complaint  Patient presents with  . Fall     (Consider location/radiation/quality/duration/timing/severity/associated sxs/prior Treatment) HPI Patient presents to the emergency department following a fall that occurred yesterday.  The patient fell out of her wheelchair when the wheelchair slid out from underneath her and she landed on her buttocks.  Son states, that she seemed to be having pain today and she is unable to tell us where she is having pain.  Patient is unable to give me any history due to her dementia the son states, that she's not had any vomiting, diarrhea, loss of consciousness or fever Past Medical History  Diagnosis Date  . Aortic stenosis     Mild to moderate, echo, femoral, 2008 /  mild, echo, March, 2011  . Mitral regurgitation     Mild, echo, November, 2008 /  mild, echo, March, 2011  . Diastolic dysfunction     Echo, 2008  . LVH (left ventricular hypertrophy)     Mild to moderate, echo, March, 2011  . Ejection fraction     65%, echo, 2008 /  70%, echo, March, 2011  . Dyslipidemia   . Hypertension   . Mitral stenosis     Severe left atrial dilatation, moderate mitral annular calcification, possible slight mitral inflow obstruction, echo, March, 2011  . Alzheimer's disease 09/08/2013  . Dyslipidemia   . Diabetes mellitus without complication    Past Surgical History  Procedure Laterality Date  . Abdominal hysterectomy    . Femur im nail Right 03/18/2014    Procedure: INTRAMEDULLARY (IM) NAIL FEMORAL;  Surgeon: Budd PalmerMichael H Handy, MD;  Location: [facility];  Service: Orthopedics;  Laterality: Right;  . Total hip arthroplasty      Right    Family History  Problem Relation Age of Onset  . Diabetes Brother    History  Substance Use Topics  . Smoking status: Never Smoker   . Smokeless tobacco: Never Used  . Alcohol Use: No   OB  History   Grav Para Term Preterm Abortions TAB SAB Ect Mult Living                 Review of Systems Level V caveat applies due to dementia  Allergies  Sulfonamide derivatives  Home Medications   Prior to Admission medications   Medication Sig Start Date End Date Taking? Authorizing Provider  acetaminophen (TYLENOL) 500 MG tablet Take 2 tablets (1,000 mg total) by mouth every 8 (eight) hours. 03/20/14  Yes Alison MurrayAlma M Devine, MD  ALPRAZolam Prudy Feeler(XANAX) 0.5 MG tablet Take 1 tablet (0.5 mg total) by mouth at bedtime as needed for anxiety. 03/20/14  Yes Alison MurrayAlma M Devine, MD  calcium-vitamin D (OSCAL WITH D) 500-200 MG-UNIT per tablet Take 1 tablet by mouth daily.     Yes Historical Provider, MD  docusate sodium 100 MG CAPS Take 100 mg by mouth 2 (two) times daily as needed for mild constipation. 03/20/14  Yes Alison MurrayAlma M Devine, MD  levothyroxine (SYNTHROID, LEVOTHROID) 100 MCG tablet Take 100 mcg by mouth daily before breakfast.   Yes Historical Provider, MD  Omega-3 Fatty Acids (FISH OIL) 1200 MG CAPS Take 1 capsule by mouth daily.   Yes Historical Provider, MD  oxyCODONE (OXY IR/ROXICODONE) 5 MG immediate release tablet Take 1 tablet (5 mg total) by mouth every 4 (four) hours as needed  for severe pain or breakthrough pain. 03/20/14  Yes Alison Murray, MD   BP 164/94  Pulse 112  Temp(Src) 97.5 F (36.4 C) (Oral)  Resp 18  SpO2 97% Physical Exam  Constitutional: She appears well-developed and well-nourished. No distress.  HENT:  Head: Normocephalic and atraumatic.  Mouth/Throat: Oropharynx is clear and moist.  Eyes: Pupils are equal, round, and reactive to light.  Neck: Normal range of motion. Neck supple.  Cardiovascular: Normal rate, regular rhythm and normal heart sounds.  Exam reveals no gallop and no friction rub.   No murmur heard. Pulmonary/Chest: Effort normal and breath sounds normal.  Musculoskeletal:       Right hip: She exhibits normal range of motion, no bony tenderness, no swelling, no  crepitus and no deformity.       Lumbar back: She exhibits tenderness. She exhibits normal range of motion, no bony tenderness, no swelling and no edema.  Neurological: She is alert. She exhibits normal muscle tone. Coordination normal.  Skin: Skin is warm and dry. No rash noted. No erythema.    ED Course  Procedures (including critical care time) Labs Review Labs Reviewed - No data to display  Imaging Review Dg Lumbar Spine Complete  04/29/2014   CLINICAL DATA:  Fall  EXAM: LUMBAR SPINE - COMPLETE 4+ VIEW  COMPARISON:  None.  FINDINGS: Stool distends the rectum. Lumbar spine shows normal alignment with no evidence of fracture. Mild T10 compression deformity age uncertain, with no direct evidence of acuity.  IMPRESSION: No definite acute findings. Mild T12 compression deformity, age uncertain, without specific findings to suggest acuity.   Electronically Signed   By: Esperanza Heir M.D.   On: 04/29/2014 00:32   Dg Pelvis 1-2 Views  04/29/2014   CLINICAL DATA:  Status post fall.  Concern for pelvic injury.  EXAM: PELVIS - 1-2 VIEW  COMPARISON:  Pelvic radiograph performed 03/18/2014  FINDINGS: There is no evidence of fracture or dislocation. Right femoral hardware is incompletely imaged, but appears grossly unremarkable. A displaced lesser trochanteric fragment is again seen. Both femoral heads are seated normally within their respective acetabula. No significant degenerative change is appreciated. The sacroiliac joints are unremarkable in appearance.  The visualized bowel gas pattern is grossly unremarkable in appearance. The rectum is filled with dense stool. Scattered phleboliths are noted within the pelvis.  IMPRESSION: 1. No evidence of fracture or dislocation. Right femoral hardware is grossly unremarkable in appearance, though incompletely imaged. 2. Rectum filled with dense stool.   Electronically Signed   By: Roanna Raider M.D.   On: 04/29/2014 00:32     Patient's x-rays didn't show any  definitive acute fracture.  I advised the son to follow up with her primary care Dr. told to return here as needed.  Is hard to determine the patient's pain patient location as she answers yes to every question.  Carlyle Dolly, PA-C 04/29/14 [phone]

## 2014-04-29 NOTE — Discharge Instructions (Signed)
Follow-up with your primary care doctor.  Return here as needed °

## 2014-10-04 ENCOUNTER — Encounter (HOSPITAL_COMMUNITY): Payer: Self-pay | Admitting: Orthopedic Surgery

## 2016-03-16 IMAGING — CT CT HEAD W/O CM
2 series · 17 of 30 positions shown, 20 images · non-contrast
Comparison: 09/11/2013

CLINICAL DATA: Leg pain post fall

EXAM:
CT HEAD WITHOUT CONTRAST
TECHNIQUE: Contiguous axial images were obtained from the base of the skull
through the vertex without intravenous contrast.

[Series 2: head w/o · axial · non-contrast · 0.43mm/px · z∈[-132,-12]mm · 9 of 31 slices shown, 12 images]
[im 4/31  brain]
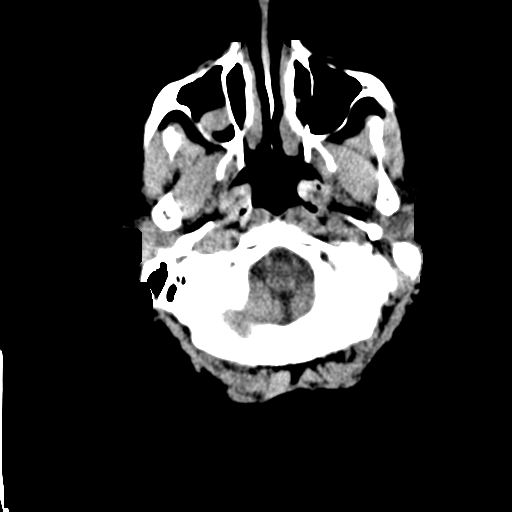
[im 4/31  bone]
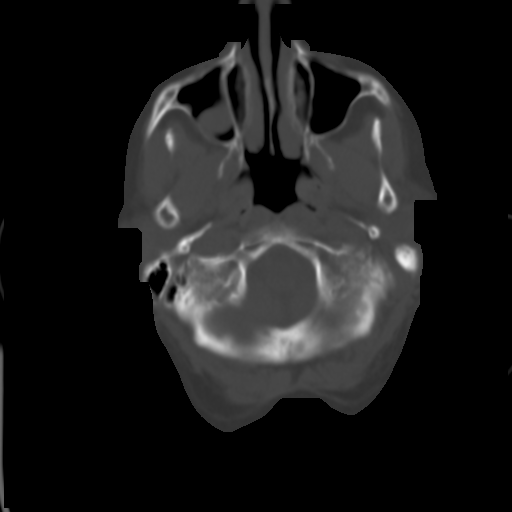
[im 7/31  brain]
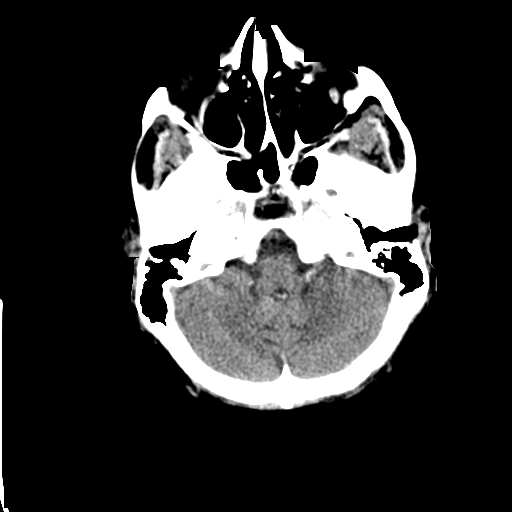
[im 10/31  brain]
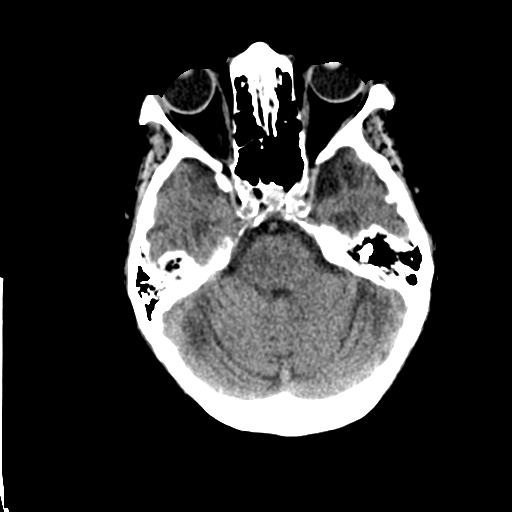
[im 13/31  brain]
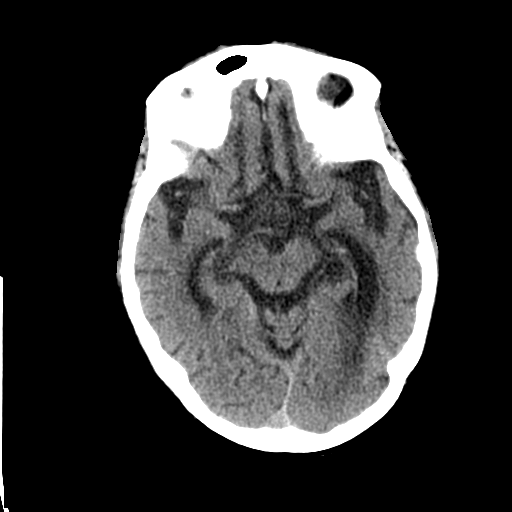
[im 16/31  brain]
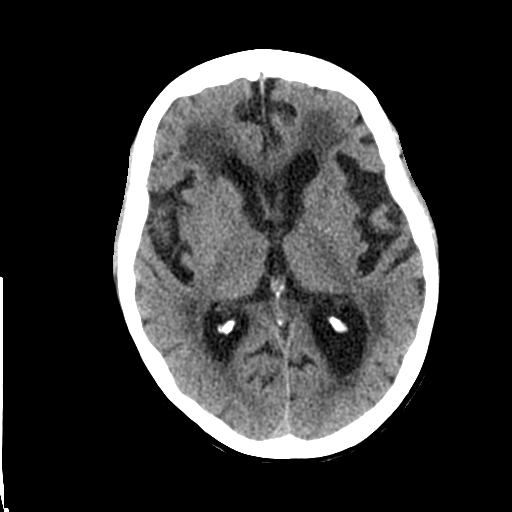
[im 16/31  bone]
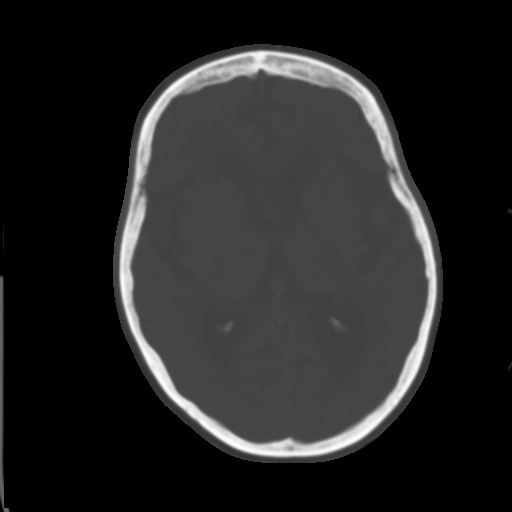
[im 19/31  brain]
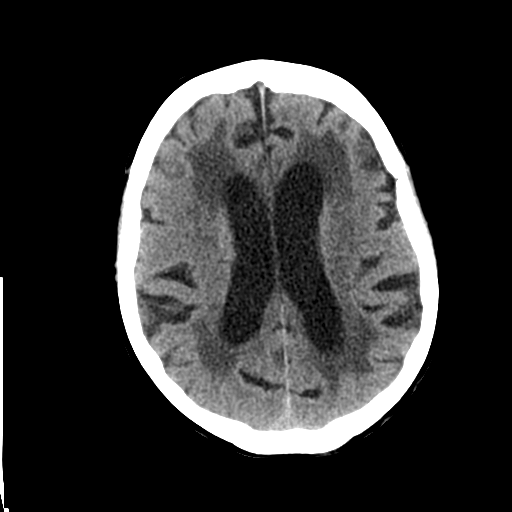
[im 22/31  brain]
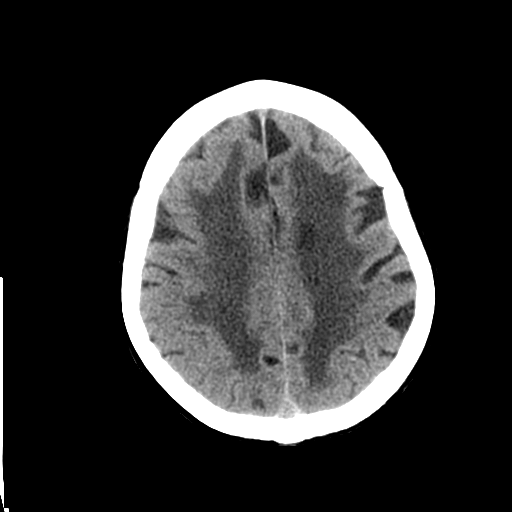
[im 25/31  brain]
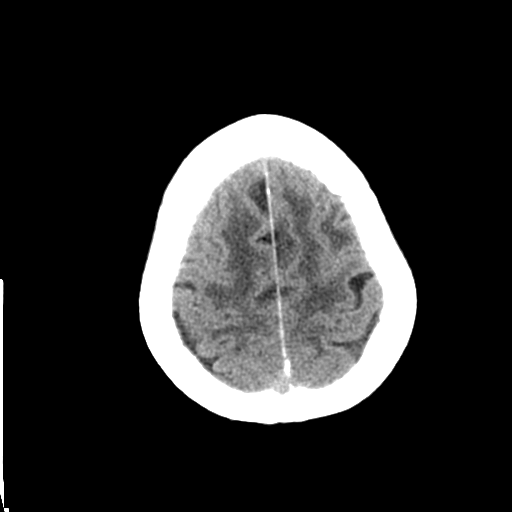
[im 28/31  brain]
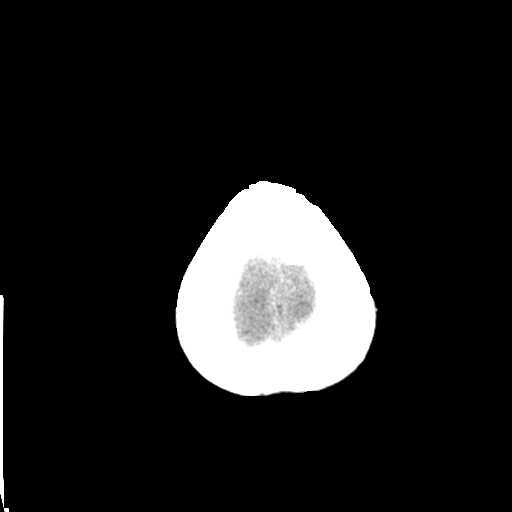
[im 28/31  bone]
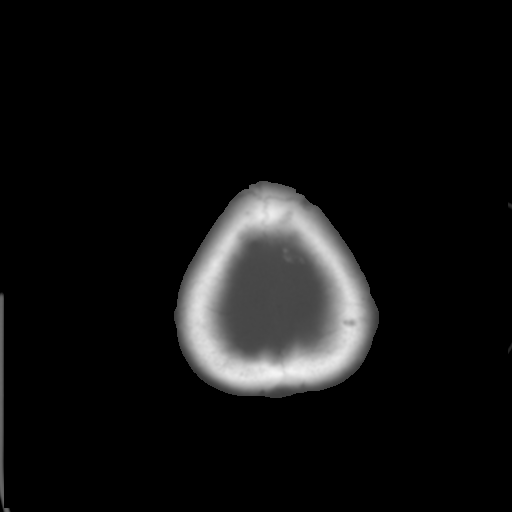

[Series 3: bone windows · axial · 0.43mm/px · z∈[-132,-15]mm · 8 of 51 slices shown]
[im 6/51  bone]
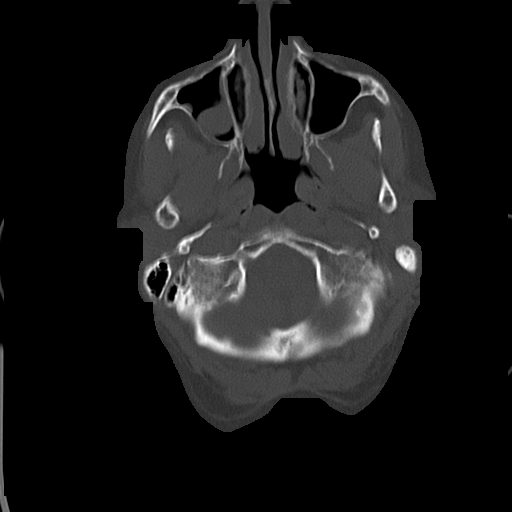
[im 12/51  bone]
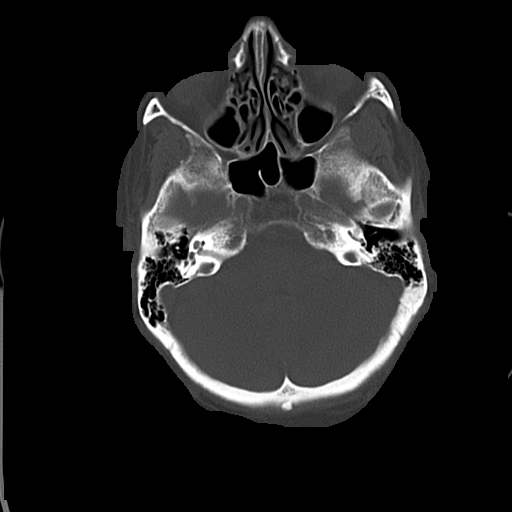
[im 17/51  bone]
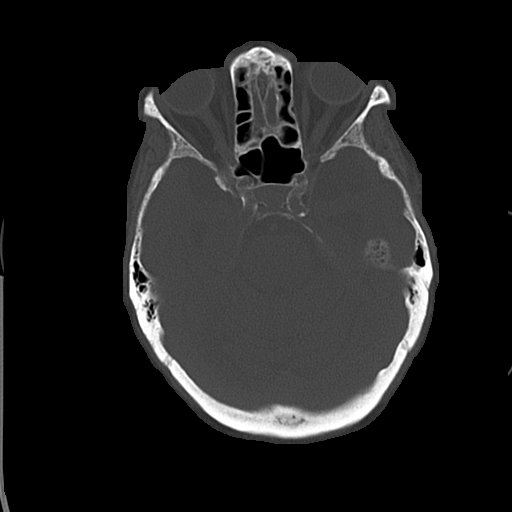
[im 23/51  bone]
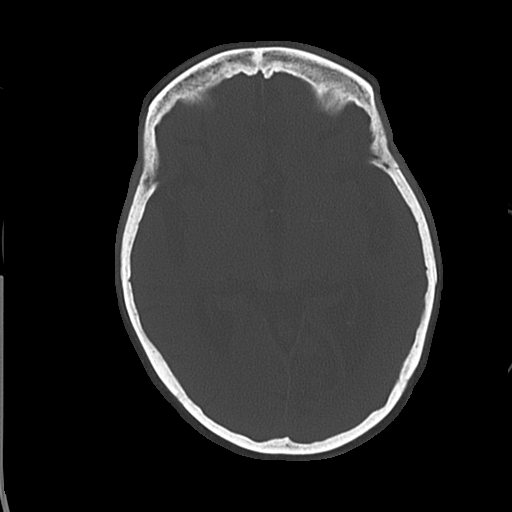
[im 28/51  bone]
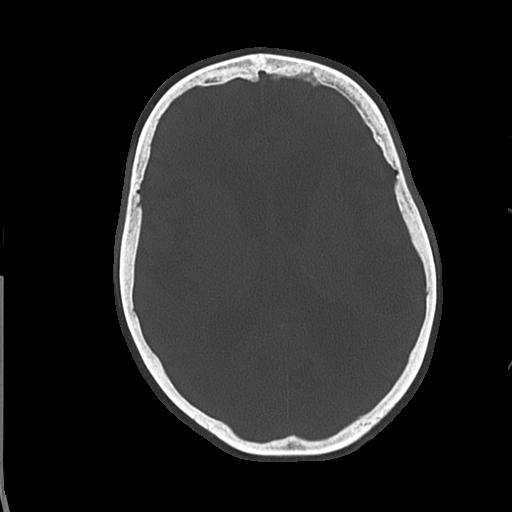
[im 34/51  bone]
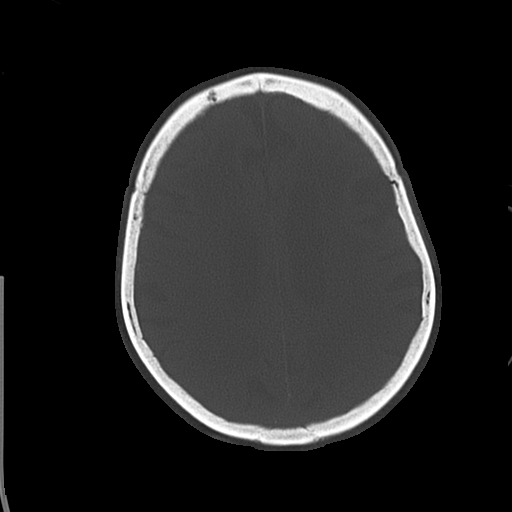
[im 39/51  bone]
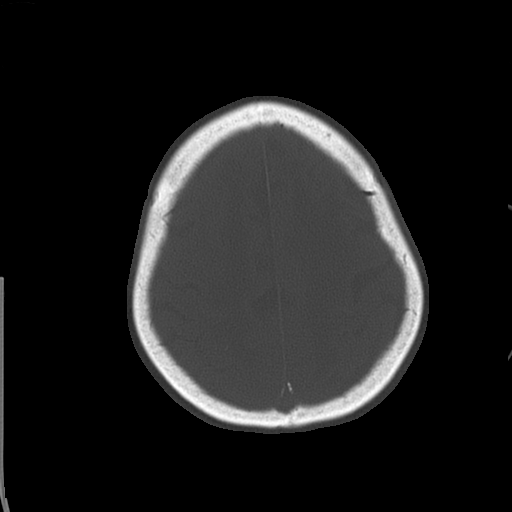
[im 45/51  bone]
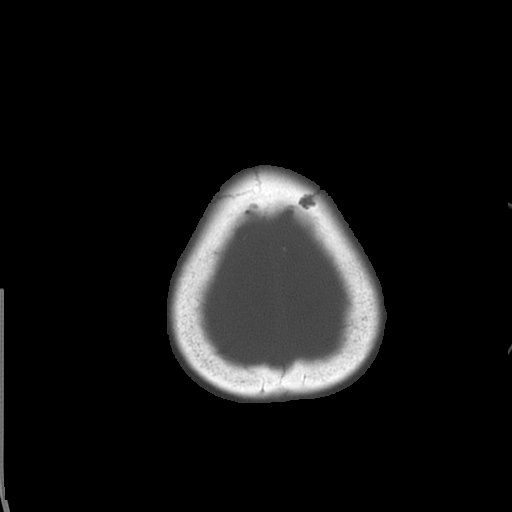

[17 of 30 positions shown; findings below may reference images not displayed]

FINDINGS: No skull fracture is noted. The mastoid air cells are unremarkable.
Stable atrophy and chronic white matter disease. There is mucosal
thickening with partial opacification right maxillary sinus. Small
mucous retention cyst left maxillary sinus.

No acute cortical infarction. No mass lesion is noted on this
unenhanced scan. Atherosclerotic calcifications of carotid siphon.
IMPRESSION: No acute intracranial abnormality. Stable atrophy and extensive
chronic white matter disease.

## 2016-03-17 IMAGING — CR DG PORTABLE PELVIS
1 series · 1 of 1 positions shown · non-contrast
Comparison: Intraoperative studies of earlier in the day.

CLINICAL DATA: Postop for right hip intra medullary rod placement.

EXAM:
PORTABLE RIGHT FEMUR - 2 VIEW; PORTABLE PELVIS 1-2 VIEWS

[AP]
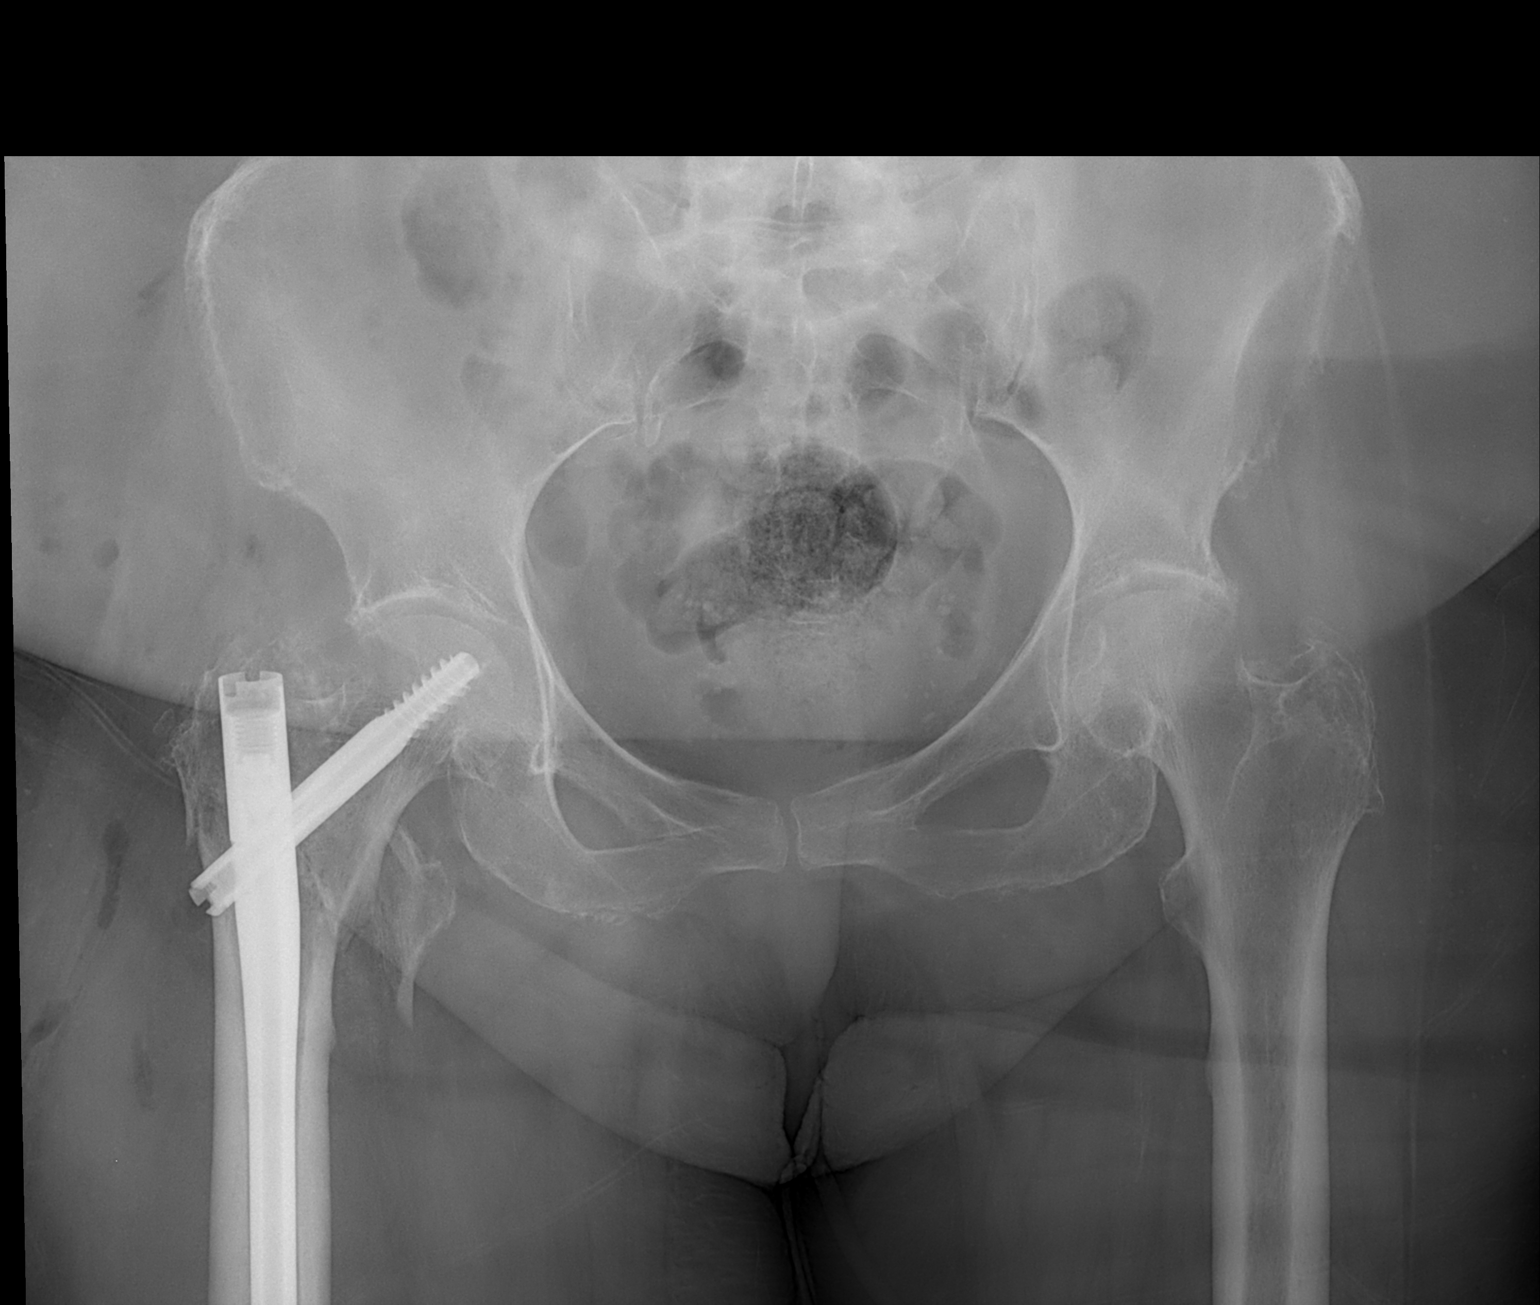

[1 of 1 positions shown; findings below may reference images not displayed]

FINDINGS: Five views of the right femur demonstrate dynamic hip screw
placement across the previously described intra trochanteric femur
fracture. Persisting comminution. No acute hardware complication.
Underlying osteoarthritis involves the medial compartment of the
knee. No new fracture identified. Vascular calcifications. The
cross-table lateral view of the proximal femur is technique
degraded.

AP view of the pelvis also demonstrates appropriate appearance of
the dynamic screw in the right femur. Mild osteoarthritis of the
left hip.
IMPRESSION: Expected appearance after fixation of the proximal right femur.

## 2018-10-22 DEATH — deceased
